# Patient Record
Sex: Female | Born: 1995
Health system: Southern US, Community
[De-identification: ages and names within clinical notes are randomized; demographics above are authoritative.]

## PROBLEM LIST (undated history)

## (undated) DIAGNOSIS — F32A Depression, unspecified: Secondary | ICD-10-CM

## (undated) DIAGNOSIS — F419 Anxiety disorder, unspecified: Secondary | ICD-10-CM

## (undated) DIAGNOSIS — K2970 Gastritis, unspecified, without bleeding: Secondary | ICD-10-CM

## (undated) HISTORY — DX: Depression, unspecified: F32.A

## (undated) HISTORY — DX: Anxiety disorder, unspecified: F41.9

## (undated) HISTORY — PX: OTHER SURGICAL HISTORY: SHX169

## (undated) HISTORY — DX: Gastritis, unspecified, without bleeding: K29.70

## (undated) HISTORY — PX: POLYPECTOMY: SHX149

---

## 2018-02-22 ENCOUNTER — Ambulatory Visit: Payer: Self-pay | Admitting: Family Medicine

## 2018-07-04 DIAGNOSIS — A048 Other specified bacterial intestinal infections: Secondary | ICD-10-CM

## 2018-07-04 HISTORY — DX: Other specified bacterial intestinal infections: A04.8

## 2019-06-17 DIAGNOSIS — R1013 Epigastric pain: Secondary | ICD-10-CM | POA: Diagnosis not present

## 2019-06-25 DIAGNOSIS — R1013 Epigastric pain: Secondary | ICD-10-CM | POA: Diagnosis not present

## 2019-06-26 ENCOUNTER — Other Ambulatory Visit: Payer: Self-pay | Admitting: Gastroenterology

## 2019-06-26 ENCOUNTER — Other Ambulatory Visit (HOSPITAL_COMMUNITY): Payer: Self-pay | Admitting: Gastroenterology

## 2019-06-26 DIAGNOSIS — R1084 Generalized abdominal pain: Secondary | ICD-10-CM

## 2019-06-26 DIAGNOSIS — R748 Abnormal levels of other serum enzymes: Secondary | ICD-10-CM

## 2019-07-12 ENCOUNTER — Ambulatory Visit
Admission: RE | Admit: 2019-07-12 | Discharge: 2019-07-12 | Disposition: A | Discharge status: Home / Self Care | Payer: BC Managed Care – PPO | Source: Ambulatory Visit | Attending: Gastroenterology | Admitting: Gastroenterology

## 2019-07-12 ENCOUNTER — Other Ambulatory Visit: Payer: Self-pay

## 2019-07-12 DIAGNOSIS — R748 Abnormal levels of other serum enzymes: Secondary | ICD-10-CM

## 2019-07-12 DIAGNOSIS — R1084 Generalized abdominal pain: Secondary | ICD-10-CM | POA: Diagnosis not present

## 2019-07-12 DIAGNOSIS — R109 Unspecified abdominal pain: Secondary | ICD-10-CM | POA: Diagnosis not present

## 2019-07-12 MED ORDER — IOHEXOL 300 MG/ML  SOLN
75.0000 mL | Freq: Once | INTRAMUSCULAR | Status: AC | PRN
  Administered 2019-07-12: 75 mL via INTRAVENOUS

## 2019-07-15 ENCOUNTER — Other Ambulatory Visit: Payer: Self-pay

## 2019-07-15 ENCOUNTER — Encounter: Payer: Self-pay | Admitting: Family Medicine

## 2019-07-15 ENCOUNTER — Ambulatory Visit: Payer: BC Managed Care – PPO | Admitting: Family Medicine

## 2019-07-15 VITALS — BP 105/70 | HR 75 | Ht 66.0 in | Wt 126.0 lb

## 2019-07-15 DIAGNOSIS — R5383 Other fatigue: Secondary | ICD-10-CM | POA: Diagnosis not present

## 2019-07-15 DIAGNOSIS — Z124 Encounter for screening for malignant neoplasm of cervix: Secondary | ICD-10-CM

## 2019-07-15 DIAGNOSIS — N946 Dysmenorrhea, unspecified: Secondary | ICD-10-CM

## 2019-07-15 NOTE — Progress Notes (Signed)
Having painful and heavy periods for few years Not on any birth control

## 2019-07-15 NOTE — Patient Instructions (Addendum)
   Website to read about methods: This website focuses on contraception but is a [email or web address]  Focus on IUD and Depoprovera-- these are the methods that treat heavy menses

## 2019-07-15 NOTE — Progress Notes (Signed)
   GYNECOLOGY PROBLEM  VISIT ENCOUNTER NOTE  Subjective:   Cynthia Cherry is a 24 y.o. G0P0000 female here for a problem GYN visit.  Current complaints: Reports having heavy and crampy periods for her "whole life" and reports taking NSAID for pain. She tried OCPs in 2018 and these helped with some of the issues of bleeding and cramping but she had other side effect. She is not nor has ever sexually active. She reports history of vague procedure/surgery when she had not had her period in high school where they "open her up"  Denies abnormal vaginal bleeding between periods, discharge, pelvic pain, or other gynecologic concerns.    Gynecologic History Patient's last menstrual period was 06/29/2019 (exact date). Contraception: none  Health Maintenance Due  Topic Date Due  . HIV Screening  03/24/2009  . TETANUS/TDAP  03/24/2013  . PAP-Cervical Cytology Screening  03/25/2015  . PAP SMEAR-Modifier  03/25/2015  . INFLUENZA VACCINE  02/02/2019     The following portions of the patient's history were reviewed and updated as appropriate: allergies, current medications, past family history, past medical history, past social history, past surgical history and problem list.  Review of Systems Pertinent items are noted in HPI.   Objective:  BP 105/70   Pulse 75   Ht 5\' 6"  (1.676 m)   Wt 126 lb (57.2 kg)   LMP 06/29/2019 (Exact Date)   BMI 20.34 kg/m  Gen: well appearing, NAD HEENT: no scleral icterus CV: RR Lung: Normal WOB Ext: warm well perfused Thyroid: not enlarged  PELVIC: Normal appearing external genitalia; normal appearing vaginal mucosa and nuliparous cervix located more to patient left as is the body of the uterus.  No abnormal discharge noted.  Pap smear obtained.  Normal uterine size, no other palpable masses, no uterine or adnexal tenderness.   Assessment and Plan:  1. Dysmenorrhea Recommended NSAIDs 600mg  starting one day before menses Reviewed options to control  cycle with OCP and possible bleeding reduction with depo or IUD. Patient would like to think about the methods before accepting an intervention.  Recommended possible dietary changes to cramping and gave patient a book reference. She also has gastritis so this might help with that as well.  - TSH - Hemoglobin A1c - CBC - Cytology - PAP  Please refer to After Visit Summary for other counseling recommendations.   Return in about 3 months (around 10/13/2019).  , MD, MPH, ABFM Attending Physician Faculty Practice- [facility] for [facility]

## 2019-07-16 LAB — CBC
Hematocrit: 36.6 % (ref 34.0–46.6)
Hemoglobin: 11.7 g/dL (ref 11.1–15.9)
MCH: 27.3 pg (ref 26.6–33.0)
MCHC: 32 g/dL (ref 31.5–35.7)
MCV: 86 fL (ref 79–97)
Platelets: 230 10*3/uL (ref 150–450)
RBC: 4.28 x10E6/uL (ref 3.77–5.28)
RDW: 13.6 % (ref 11.7–15.4)
WBC: 3.8 10*3/uL (ref 3.4–10.8)

## 2019-07-16 LAB — HEMOGLOBIN A1C
Est. average glucose Bld gHb Est-mCnc: 97 mg/dL
Hgb A1c MFr Bld: 5 % (ref 4.8–5.6)

## 2019-07-16 LAB — TSH: TSH: 1.3 u[IU]/mL (ref 0.450–4.500)

## 2019-07-17 ENCOUNTER — Telehealth: Payer: Self-pay | Admitting: *Deleted

## 2019-07-17 NOTE — Telephone Encounter (Signed)
Pt informed of lab results, and adding in a vitamin d level and we will call her back with those results and her pap results.

## 2019-07-17 NOTE — Telephone Encounter (Signed)
-----   Message from Federico Flake, MD sent at 07/16/2019  5:13 PM EST ----- Labs are all within normal limits. Will see if Vitamin can be added to samples taken.

## 2019-07-18 LAB — SPECIMEN STATUS REPORT

## 2019-07-18 LAB — CYTOLOGY - PAP: Diagnosis: NEGATIVE

## 2019-07-18 LAB — VITAMIN D 25 HYDROXY (VIT D DEFICIENCY, FRACTURES): Vit D, 25-Hydroxy: 54.4 ng/mL (ref 30.0–100.0)

## 2019-08-08 DIAGNOSIS — R748 Abnormal levels of other serum enzymes: Secondary | ICD-10-CM | POA: Diagnosis not present

## 2019-08-08 DIAGNOSIS — Z8619 Personal history of other infectious and parasitic diseases: Secondary | ICD-10-CM | POA: Diagnosis not present

## 2019-09-03 DIAGNOSIS — Z8619 Personal history of other infectious and parasitic diseases: Secondary | ICD-10-CM | POA: Diagnosis not present

## 2019-09-05 DIAGNOSIS — R748 Abnormal levels of other serum enzymes: Secondary | ICD-10-CM | POA: Diagnosis not present

## 2019-09-12 ENCOUNTER — Encounter: Payer: Self-pay | Admitting: Radiology

## 2019-11-24 DIAGNOSIS — Z20822 Contact with and (suspected) exposure to covid-19: Secondary | ICD-10-CM | POA: Diagnosis not present

## 2020-01-07 DIAGNOSIS — Z20822 Contact with and (suspected) exposure to covid-19: Secondary | ICD-10-CM | POA: Diagnosis not present

## 2020-01-31 DIAGNOSIS — Z760 Encounter for issue of repeat prescription: Secondary | ICD-10-CM | POA: Diagnosis not present

## 2020-02-12 DIAGNOSIS — Z20822 Contact with and (suspected) exposure to covid-19: Secondary | ICD-10-CM | POA: Diagnosis not present

## 2020-03-13 ENCOUNTER — Other Ambulatory Visit
Admission: RE | Admit: 2020-03-13 | Discharge: 2020-03-13 | Disposition: A | Discharge status: Home / Self Care | Payer: BC Managed Care – PPO | Source: Ambulatory Visit | Attending: Family Medicine | Admitting: Family Medicine

## 2020-03-13 DIAGNOSIS — M79661 Pain in right lower leg: Secondary | ICD-10-CM | POA: Diagnosis present

## 2020-03-13 DIAGNOSIS — M79662 Pain in left lower leg: Secondary | ICD-10-CM | POA: Insufficient documentation

## 2020-03-13 LAB — FIBRIN DERIVATIVES D-DIMER (ARMC ONLY): Fibrin derivatives D-dimer (ARMC): 312.14 ng/mL (FEU) (ref 0.00–499.00)

## 2021-04-01 ENCOUNTER — Ambulatory Visit: Payer: BC Managed Care – PPO | Admitting: Obstetrics & Gynecology

## 2021-04-01 ENCOUNTER — Encounter: Payer: Self-pay | Admitting: Obstetrics & Gynecology

## 2021-04-01 ENCOUNTER — Other Ambulatory Visit: Payer: Self-pay

## 2021-04-01 ENCOUNTER — Ambulatory Visit (INDEPENDENT_AMBULATORY_CARE_PROVIDER_SITE_OTHER): Payer: BC Managed Care – PPO | Admitting: Obstetrics & Gynecology

## 2021-04-01 VITALS — BP 102/67 | HR 65 | Ht 67.0 in | Wt 133.0 lb

## 2021-04-01 DIAGNOSIS — N946 Dysmenorrhea, unspecified: Secondary | ICD-10-CM

## 2021-04-01 DIAGNOSIS — N921 Excessive and frequent menstruation with irregular cycle: Secondary | ICD-10-CM | POA: Insufficient documentation

## 2021-04-01 NOTE — Progress Notes (Signed)
GYNECOLOGY OFFICE VISIT NOTE  History:   Cynthia Cherry is a 25 y.o. G0P0000 here today for discussion about management of chronic and worsening heavy and irregular bleeding accompanied by pain. Had visit in 07/2019 to discuss this, was offered hormonal therapy that she declined and NSAIDs were recommended which she did not take due to gastric upset.  She feels pain and symptoms have worsened over the past year. She denies any abnormal vaginal discharge or other concerns.    Past Medical History:  Diagnosis Date   Gastritis    Helicobacter pylori (H. pylori) infection 2020   Treated with anbx in 2020    Past Surgical History:  Procedure Laterality Date   OTHER SURGICAL HISTORY  2010?   Reports needing to be "opened up" to have period when in high school. Since this procedure she has had regular menses. Unsure if this was inperforate hymen or cervical stenosis   POLYPECTOMY      The following portions of the patient's history were reviewed and updated as appropriate: allergies, current medications, past family history, past medical history, past social history, past surgical history and problem list.   Health Maintenance:  Normal pap on 07/15/2019.   Review of Systems:  Pertinent items noted in HPI and remainder of comprehensive ROS otherwise negative.  Physical Exam:  BP 102/67   Pulse 65   Ht 5\' 7"  (1.702 m)   Wt 133 lb (60.3 kg)   LMP 03/21/2021 (Exact Date)   BMI 20.83 kg/m  CONSTITUTIONAL: Well-developed, well-nourished female in no acute distress.  HEENT:  Normocephalic, atraumatic. External right and left ear normal. No scleral icterus.  NECK: Normal range of motion, supple, no masses noted on observation SKIN: No rash noted. Not diaphoretic. No erythema. No pallor. MUSCULOSKELETAL: Normal range of motion. No edema noted. NEUROLOGIC: Alert and oriented to person, place, and time. Normal muscle tone coordination. No cranial nerve deficit noted. PSYCHIATRIC: Normal  mood and affect. Normal behavior. Normal judgment and thought content. CARDIOVASCULAR: Normal heart rate noted RESPIRATORY: Effort and breath sounds normal, no problems with respiration noted ABDOMEN: No masses noted.Nontender to palpation. No other overt distention noted.   PELVIC: Deferred     Assessment and Plan:     1. Menometrorrhagia 2. Dysmenorrhea Presentation concerning for endometriosis. Offered presumptive treatment with hormonal therapy (OCPs), patient reports she had worsening mental health symptoms on COCs in the past.  Offered trial of progestin only formulations, (Depo, pills, IUD), she declined. Discussed newer therapies such as Orilissa and MyFembree, but emphasized that they have similar hormones as OCPs but also have added GNRH-antagonist medication and may have more side effects.  She declined this. Offered surgical evaluation and management, but emphasized that hormonal therapy is used after surgery to help prevent further lesions/treat lesions not fulgurated; and definitive surgery in the form of hysterectomy and removal of ovaries will result in needing HRT for decades. She is not interested in any of these modalities for now. To treat her pain, recommended NSAIDs, she reports getting gastric upset with these and avoids taking them.  She ws advised to take Tylenol for pain. In the meantime, will get pelvic ultrasound to evaluate for other possible etiologies of her symptoms and manage accordingly. - 03/23/2021 PELVIC COMPLETE WITH TRANSVAGINAL; Future   Routine preventative health maintenance measures emphasized. Please refer to After Visit Summary for other counseling recommendations.   Return for any gynecologic concerns.    I spent 20 minutes dedicated to the care of this  patient including pre-visit review of records, face to face time with the patient discussing her conditions and treatments and post visit orders.    Jaynie Collins, MD, FACOG Obstetrician & Gynecologist,  [facility] for [employer], [facility]

## 2021-04-01 NOTE — Patient Instructions (Signed)
Endometriosis  Endometriosis is a condition in which a tissue similar to the endometrium grows in places outside the uterus. The endometrium is a tissue that forms the lining of the uterus. This tissue can grow in the organs that create the eggs (ovaries), the tubes that carry the eggs to the uterus (fallopian tubes), the vagina, and the bowel. This tissue most often grows on the ovaries and inner lining of the pelvic cavity (peritoneum). When the uterus sheds the endometrium every menstrual cycle, there is bleeding wherever these types of tissue are located. This can cause pain because blood is irritating to tissues that are not normally exposed to it. Endometriosis canalso make it harder for a woman to get pregnant. What are the causes? The cause of this condition is not known. What increases the risk? The following factors may make you more likely to develop this condition: Having a family history of endometriosis. Having never given birth. Starting your period at 10 years of age or younger. What are the signs or symptoms? Often, there are no symptoms of this condition. If you do have symptoms, they may: Vary depending on where the abnormal tissue is growing. Occur during your menstrual period (most often) or at the middle of your cycle. Come and go. You may have no symptoms during some months. Stop when you no longer have your monthly periods (menopause). Symptoms may include: Pain in the area between your hip bones (pelvis). Heavier bleeding during periods. Menstrual periods that happen more than once a month. Pain during sex. Pain in the back or abdomen. Painful bowel movements. Not being able to get pregnant. How is this diagnosed? This condition is diagnosed based on your symptoms and a physical exam. You may have tests, such as: Blood tests and urine tests to help rule out other causes. Ultrasound to look for tissues that are not normal. This is often done over your skin. It is  sometimes done through the vagina (transvaginal). X-ray of the lower bowel (barium enema). CT scan. MRI. To confirm the diagnosis, your health care provider may use a device with a small camera to check tissue inside your abdomen (laparoscopy). Abnormal tissue may be removed and checked in a lab (biopsy). How is this treated? There is no cure for this condition. Treatment focuses on controlling your symptoms. The type of treatment also depends on whether you want to become pregnant in the future. This condition may be treated with: Medicines. These may include: Medicines to relieve pain, including NSAIDs such as ibuprofen. Hormone therapy. This uses artificial hormones to slow the growth of the abnormal tissue. This may include hormonal birth control, such as pills. Surgery to remove the abnormal tissue. During surgery: Tissue may be removed using a laparoscope and a laser (laparoscopic laser treatment). The fallopian tubes, uterus, and ovaries may be removed (hysterectomy). This is done in very severe cases. Follow these instructions at home: Get regular exercise. Limit alcohol use. Eat a balanced diet. Avoid caffeine. Take over-the-counter and prescription medicines only as told by your health care provider. Keep all follow-up visits as told by your health care provider. This is important. Where to find more information American College of Obstetricians and Gynecologists: https://www.acog.org/ Office on Women's Health: https://www.womenshealth.gov/ Contact a health care provider if: You are having new pain or trouble controlling pain. You have problems getting pregnant. You have a fever. Get help right away if you have: Severe pain that does not get better with medicine. Severe nausea and vomiting, or   if you cannot eat or drink without vomiting. Pain that affects your abdomen only on the lower, right side. Pain in your abdomen that gets worse. Swelling in your abdomen. Blood in  your stool (feces). Summary Endometriosis is a condition in which a tissue similar to the endometrium grows in places outside the uterus. The endometrium is a tissue that forms the lining of the uterus. The cause of this condition is not known. This condition may be treated with medicines to relieve pain, hormone therapy, or surgery. If you have this condition, get regular exercise, limit alcohol use, and avoid caffeine. Get help right away if you have severe pain that does not get better with medicine, or if you have severe nausea and vomiting or blood in your stool. This information is not intended to replace advice given to you by your health care provider. Make sure you discuss any questions you have with your healthcare provider. Document Revised: 08/07/2019 Document Reviewed: 08/07/2019 Elsevier Patient Education  2021 Elsevier Inc.  

## 2021-04-09 ENCOUNTER — Other Ambulatory Visit: Payer: Self-pay

## 2021-04-09 ENCOUNTER — Ambulatory Visit (HOSPITAL_COMMUNITY)
Admission: RE | Admit: 2021-04-09 | Discharge: 2021-04-09 | Disposition: A | Discharge status: Home / Self Care | Payer: BC Managed Care – PPO | Source: Ambulatory Visit | Attending: Obstetrics & Gynecology | Admitting: Obstetrics & Gynecology

## 2021-04-09 DIAGNOSIS — N921 Excessive and frequent menstruation with irregular cycle: Secondary | ICD-10-CM

## 2021-04-09 DIAGNOSIS — N946 Dysmenorrhea, unspecified: Secondary | ICD-10-CM | POA: Diagnosis present

## 2021-06-03 ENCOUNTER — Encounter: Payer: Self-pay | Admitting: Radiology

## 2021-10-24 IMAGING — CT CT ABD-PELV W/ CM
2 of 4 series · 16 of 46 positions shown, 18 images · IV contrast (omnipaque)
Comparison: None.

CLINICAL DATA: Lower abdominal pain for 1 month. Elevated lipase.

EXAM:
CT ABDOMEN AND PELVIS WITH CONTRAST
TECHNIQUE: Multidetector CT imaging of the abdomen and pelvis was performed
using the standard protocol following bolus administration of
intravenous contrast.
CONTRAST:  75mL OMNIPAQUE IOHEXOL 300 MG/ML  SOLN

[Series 2: abd pelvis 5.00 · axial · 0.66mm/px · z∈[-1508,-1068]mm · 13 of 96 slices shown, 15 images]
[im 4/96  soft-tissue]
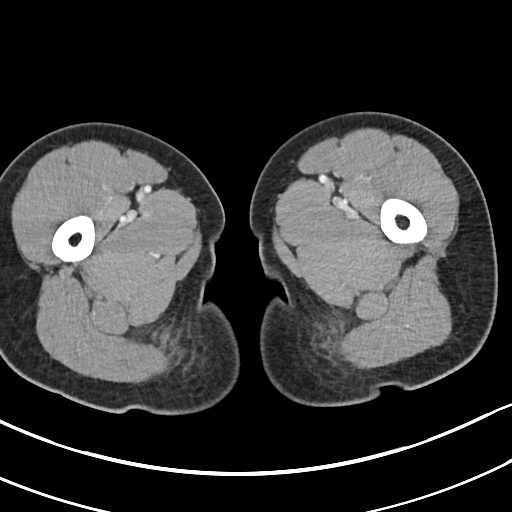
[im 4/96  bone]
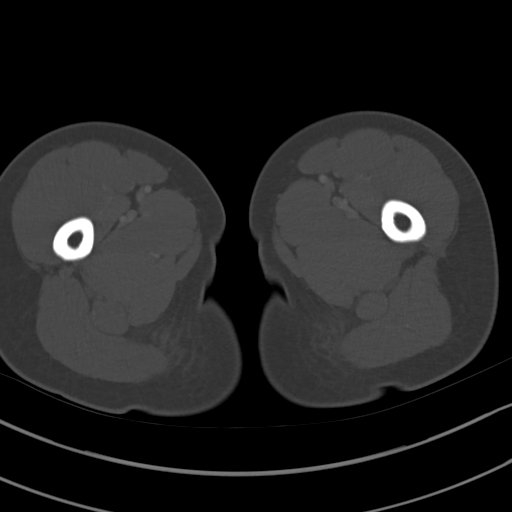
[im 12/96  soft-tissue]
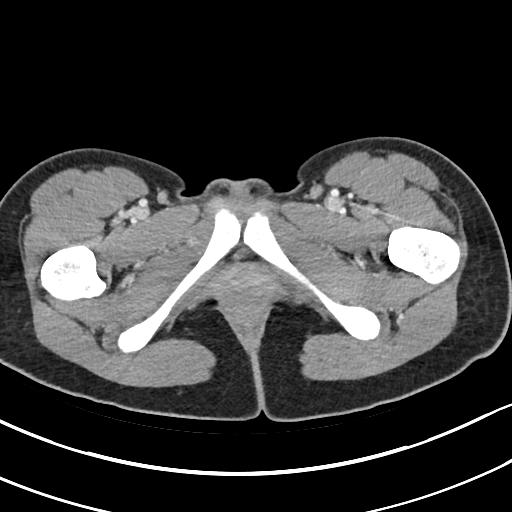
[im 20/96  soft-tissue]
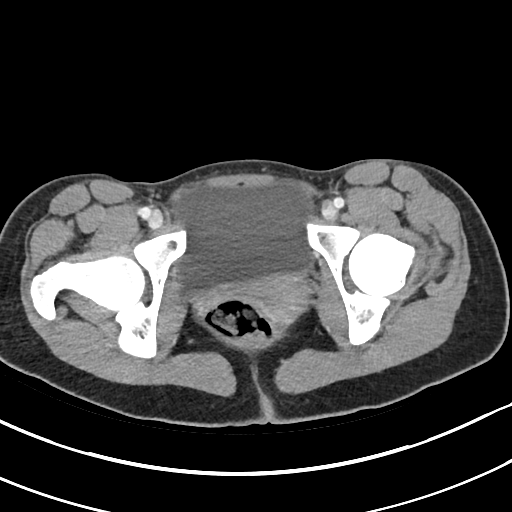
[im 27/96  soft-tissue]
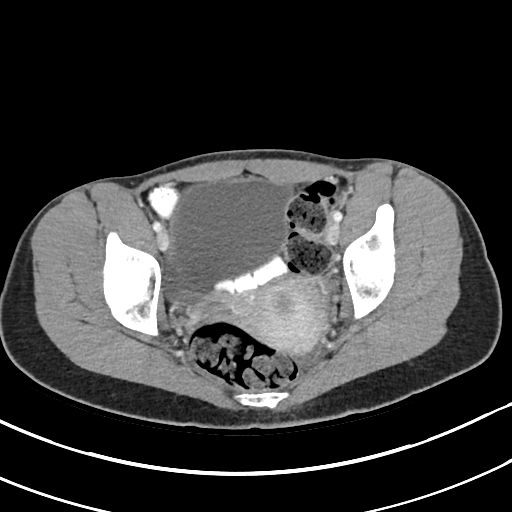
[im 35/96  soft-tissue]
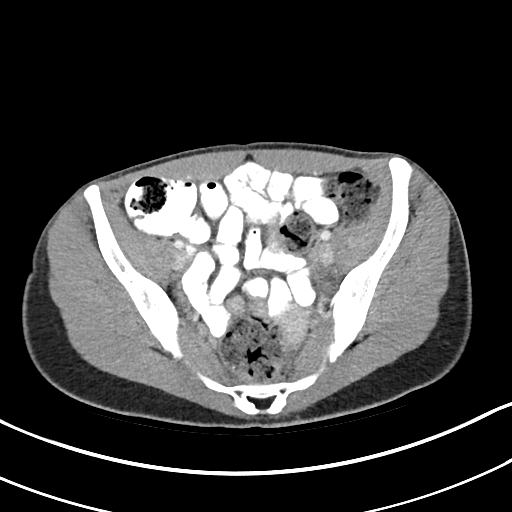
[im 42/96  soft-tissue]
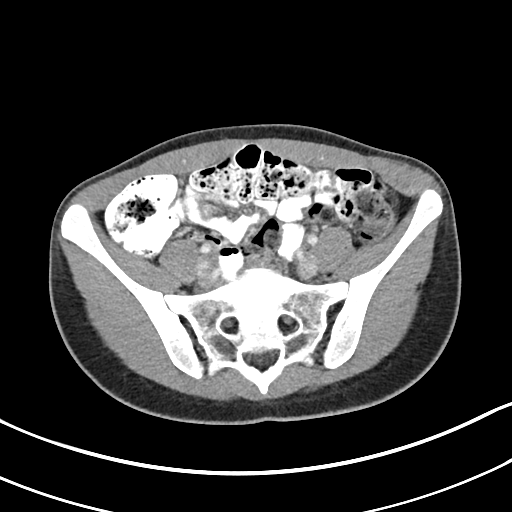
[im 50/96  soft-tissue]
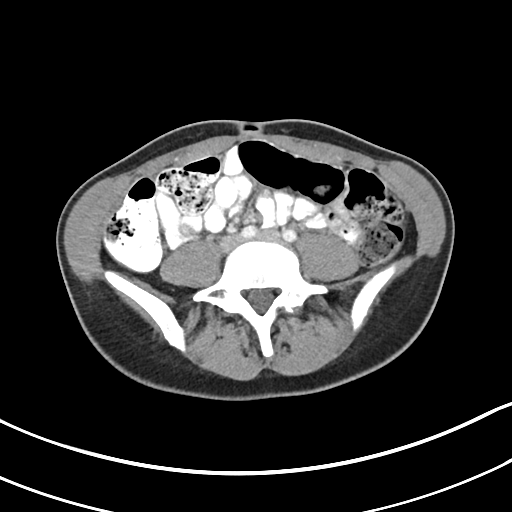
[im 54/96  soft-tissue]
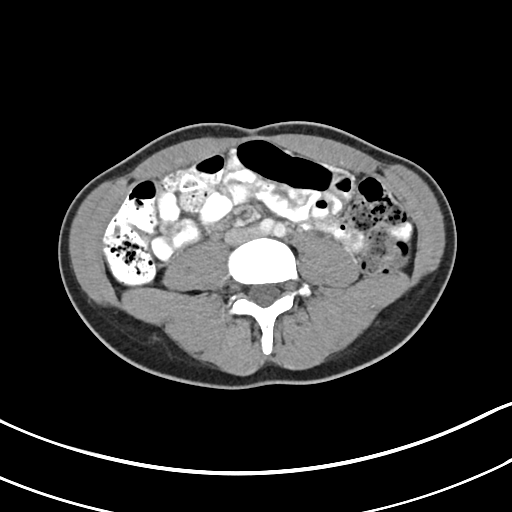
[im 61/96  soft-tissue]
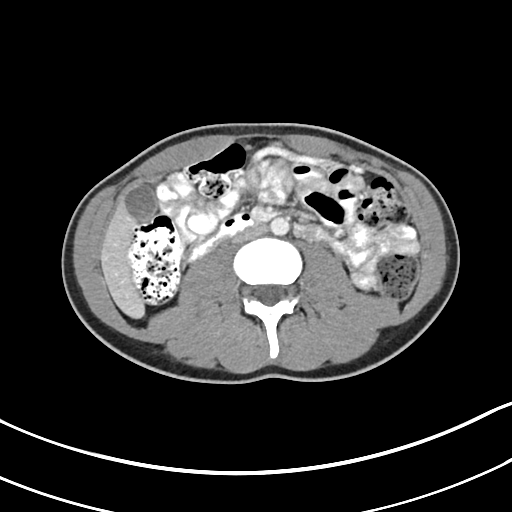
[im 61/96  bone]
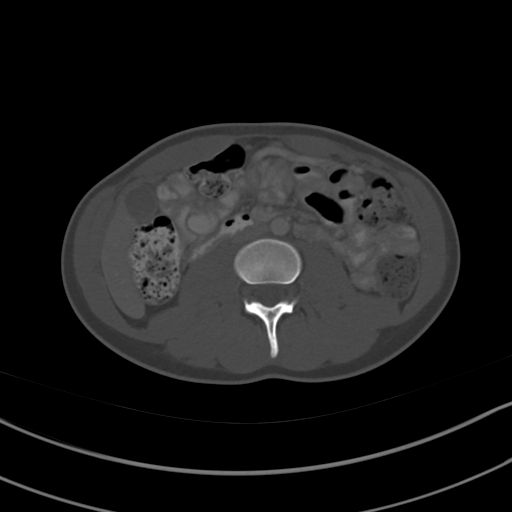
[im 69/96  soft-tissue]
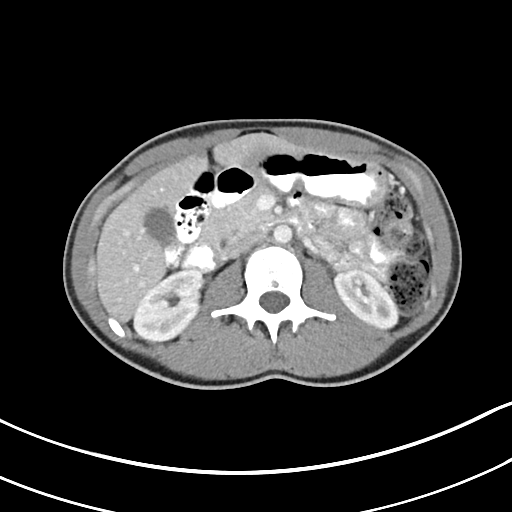
[im 77/96  soft-tissue]
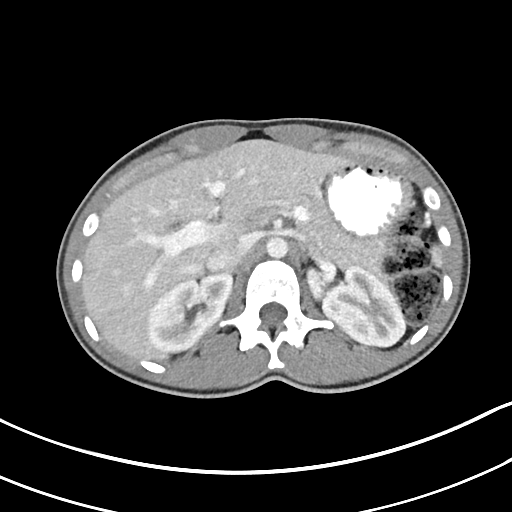
[im 84/96  soft-tissue]
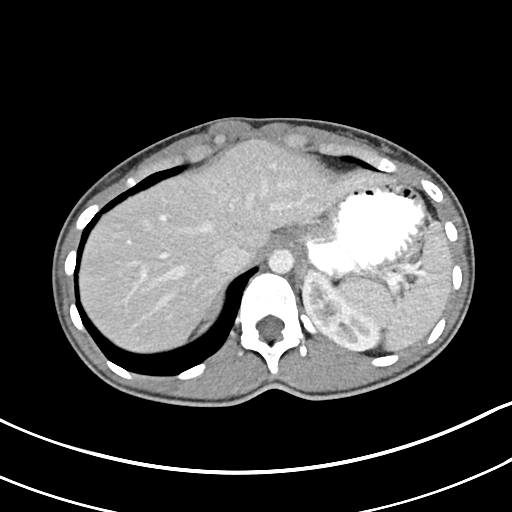
[im 92/96  soft-tissue]
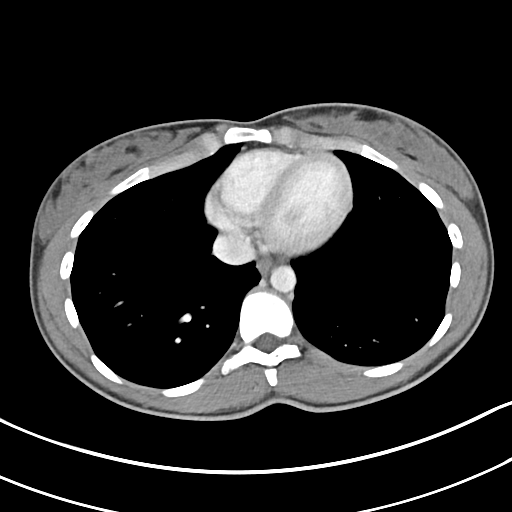

[Series 4: coronals abd pelvis 2.00 cor · coronal · 0.66mm/px · 3 of 111 slices shown]
[im 37/111  soft-tissue]
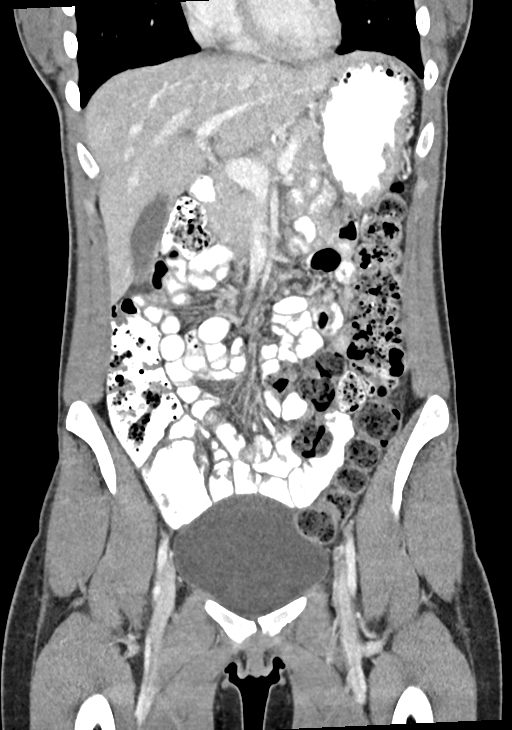
[im 49/111  soft-tissue]
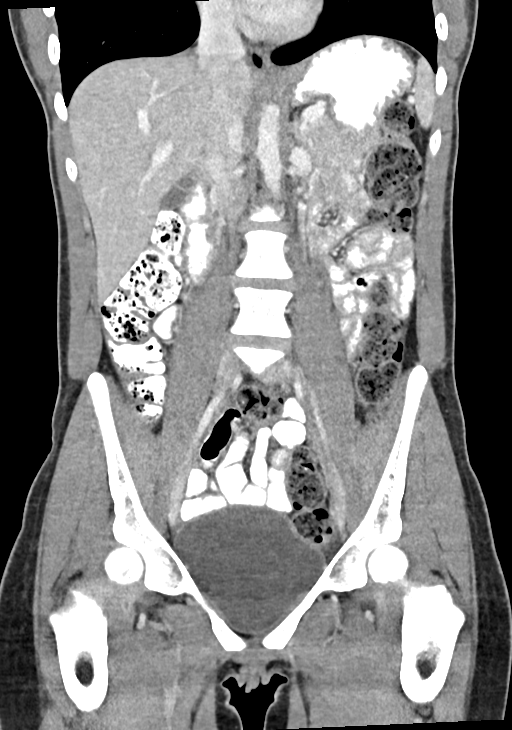
[im 62/111  soft-tissue]
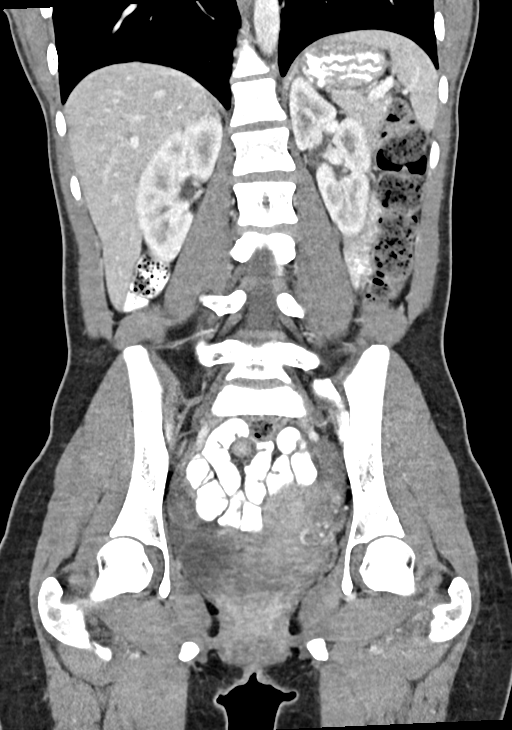

[16 of 46 positions shown; findings below may reference images not displayed]

FINDINGS: Lower Chest: No acute findings.

Hepatobiliary: No hepatic masses identified. Gallbladder is
unremarkable. No evidence of biliary ductal dilatation.

Pancreas:  No mass or inflammatory changes.

Spleen: Within normal limits in size and appearance.

Adrenals/Urinary Tract: No masses identified. No evidence of
hydronephrosis.

Stomach/Bowel: No evidence of obstruction, inflammatory process or
abnormal fluid collections. Normal appendix visualized.

Vascular/Lymphatic: No pathologically enlarged lymph nodes. No
abdominal aortic aneurysm.

Reproductive:  No mass or other significant abnormality.

Other:  None.

Musculoskeletal:  No suspicious bone lesions identified.
IMPRESSION: Negative. No acute findings or other significant abnormality.

## 2022-05-30 ENCOUNTER — Inpatient Hospital Stay: Payer: BC Managed Care – PPO | Attending: Internal Medicine | Admitting: Internal Medicine

## 2022-05-30 ENCOUNTER — Encounter: Payer: Self-pay | Admitting: Internal Medicine

## 2022-05-30 ENCOUNTER — Inpatient Hospital Stay: Payer: BC Managed Care – PPO

## 2022-05-30 VITALS — BP 100/73 | HR 67 | Temp 98.7°F | Resp 20 | Wt 130.6 lb

## 2022-05-30 DIAGNOSIS — E611 Iron deficiency: Secondary | ICD-10-CM | POA: Diagnosis not present

## 2022-05-30 DIAGNOSIS — Z79899 Other long term (current) drug therapy: Secondary | ICD-10-CM | POA: Diagnosis not present

## 2022-05-30 DIAGNOSIS — Z8619 Personal history of other infectious and parasitic diseases: Secondary | ICD-10-CM | POA: Diagnosis not present

## 2022-05-30 DIAGNOSIS — Z809 Family history of malignant neoplasm, unspecified: Secondary | ICD-10-CM | POA: Insufficient documentation

## 2022-05-30 DIAGNOSIS — R760 Raised antibody titer: Secondary | ICD-10-CM | POA: Diagnosis not present

## 2022-05-30 DIAGNOSIS — D709 Neutropenia, unspecified: Secondary | ICD-10-CM | POA: Diagnosis not present

## 2022-05-30 DIAGNOSIS — F32A Depression, unspecified: Secondary | ICD-10-CM | POA: Insufficient documentation

## 2022-05-30 DIAGNOSIS — N920 Excessive and frequent menstruation with regular cycle: Secondary | ICD-10-CM | POA: Insufficient documentation

## 2022-05-30 DIAGNOSIS — N921 Excessive and frequent menstruation with irregular cycle: Secondary | ICD-10-CM | POA: Diagnosis not present

## 2022-05-30 DIAGNOSIS — D500 Iron deficiency anemia secondary to blood loss (chronic): Secondary | ICD-10-CM

## 2022-05-30 DIAGNOSIS — D72819 Decreased white blood cell count, unspecified: Secondary | ICD-10-CM | POA: Insufficient documentation

## 2022-05-30 DIAGNOSIS — D509 Iron deficiency anemia, unspecified: Secondary | ICD-10-CM | POA: Insufficient documentation

## 2022-05-30 LAB — CBC WITH DIFFERENTIAL/PLATELET
Abs Immature Granulocytes: 0 10*3/uL (ref 0.00–0.07)
Basophils Absolute: 0 10*3/uL (ref 0.0–0.1)
Basophils Relative: 1 %
Eosinophils Absolute: 0.1 10*3/uL (ref 0.0–0.5)
Eosinophils Relative: 2 %
HCT: 36.9 % (ref 36.0–46.0)
Hemoglobin: 12 g/dL (ref 12.0–15.0)
Immature Granulocytes: 0 %
Lymphocytes Relative: 39 %
Lymphs Abs: 1.1 10*3/uL (ref 0.7–4.0)
MCH: 28.9 pg (ref 26.0–34.0)
MCHC: 32.5 g/dL (ref 30.0–36.0)
MCV: 88.9 fL (ref 80.0–100.0)
Monocytes Absolute: 0.2 10*3/uL (ref 0.1–1.0)
Monocytes Relative: 7 %
Neutro Abs: 1.5 10*3/uL — ABNORMAL LOW (ref 1.7–7.7)
Neutrophils Relative %: 51 %
Platelets: 220 10*3/uL (ref 150–400)
RBC: 4.15 MIL/uL (ref 3.87–5.11)
RDW: 12.8 % (ref 11.5–15.5)
WBC: 2.9 10*3/uL — ABNORMAL LOW (ref 4.0–10.5)
nRBC: 0 % (ref 0.0–0.2)

## 2022-05-30 LAB — FOLATE: Folate: 9.9 ng/mL (ref >5.9)

## 2022-05-30 LAB — HEPATITIS B SURFACE ANTIGEN: Hepatitis B Surface Ag: NONREACTIVE

## 2022-05-30 LAB — HEPATITIS B CORE ANTIBODY, IGM: Hep B C IgM: NONREACTIVE

## 2022-05-30 LAB — HIV ANTIBODY (ROUTINE TESTING W REFLEX): HIV Screen 4th Generation wRfx: NONREACTIVE

## 2022-05-30 LAB — HEPATITIS C ANTIBODY: HCV Ab: NONREACTIVE

## 2022-05-30 NOTE — Progress Notes (Addendum)
[facility]  [phone] [phone]  ID: Cynthia Cherry OB: 31-Aug-1995  MR#: [id number]  AJO#:[id number]  Patient Care Team: Patrice Paradise, MD as PCP - General (Physician Assistant)  REFERRING PROVIDER: Dr. Merlinda Frederick  REASON FOR REFERRAL: leukopenia, neutropenia  HPI: Cynthia Cherry is a 26 y.o. female with past medical history of H. pylori infection in 2020, depression was referred to hematology for further workup of leukopenia.  Patient reports increased infections primarily colds since 2020.  Denies any pneumonias or hospitalizations.  She gets colds easily when exposed to sick people. There is no pattern to it. It depends on the exposure. Denies any fevers, night sweats, weight loss, shortness of breath, chest pain.  She has heavy menstrual period requiring frequent pad changes on initial 3 to 4 days, lasts total of 7 days.  Has history of iron deficiency and has been on oral iron for years.  ANA was positive and she has been referred to rheumatology in [facility] by PCP.  She has intermittent right shoulder and wrist pain.  Otherwise denies any other joint pains, oral ulcers, rash, Raynaud's, photosensitivity..   Labs reviewed. CBC with differential showed WBC 2.6, hemoglobin 12.4, platelet 169, ANC 1.3.  She has leukopenia dating back to 2020.  B12 > 1500.  Ferritin 8, saturation 57%.  REVIEW OF SYSTEMS:   ROS  As per HPI. Otherwise, a complete review of systems is negative.  PAST MEDICAL HISTORY: Past Medical History:  Diagnosis Date   Gastritis    Helicobacter pylori (H. pylori) infection 2020   Treated with anbx in 2020    PAST SURGICAL HISTORY: Past Surgical History:  Procedure Laterality Date   OTHER SURGICAL HISTORY  2010?   Reports needing to be "opened up" to have period when in high school. Since this procedure she has had regular menses. Unsure if this was inperforate hymen or cervical stenosis    POLYPECTOMY      FAMILY HISTORY: Family History  Problem Relation Age of Onset   Cancer Sister    Cancer Paternal Uncle    Cancer Maternal Grandfather    Endometriosis Neg Hx    Fibroids Neg Hx    Diabetes Neg Hx     HEALTH MAINTENANCE: Social History   Tobacco Use   Smoking status: Never   Smokeless tobacco: Never  Vaping Use   Vaping Use: Never used  Substance Use Topics   Alcohol use: Not Currently   Drug use: Not Currently     Allergies  Allergen Reactions   Almond (Diagnostic)    Apple Itching   Daucus Carota Itching   Pollen Extract Itching    Current Outpatient Medications  Medication Sig Dispense Refill   FLUoxetine (PROZAC) 10 MG tablet Take 10 mg by mouth daily.     No current facility-administered medications for this visit.    OBJECTIVE: Vitals:   05/30/22 1006  BP: 100/73  Pulse: 67  Resp: 20  Temp: 98.7 F (37.1 C)  SpO2: 100%     Body mass index is 20.45 kg/m.      General: Well-developed, well-nourished, no acute distress. Eyes: Pink conjunctiva, anicteric sclera. HEENT: Normocephalic, moist mucous membranes, clear oropharnyx. Lungs: Clear to auscultation bilaterally. Heart: Regular rate and rhythm. No rubs, murmurs, or gallops. Abdomen: Soft, nontender, nondistended. No organomegaly noted, normoactive bowel sounds. Musculoskeletal: No edema, cyanosis, or clubbing. Neuro: Alert, answering all questions appropriately. Cranial nerves grossly intact. Skin: No rashes or petechiae noted. Psych: Normal affect. Lymphatics:  No cervical, calvicular, axillary or inguinal LAD.   LAB RESULTS:  No results found for: "NA", "K", "CL", "CO2", "GLUCOSE", "BUN", "CREATININE", "CALCIUM", "PROT", "ALBUMIN", "AST", "ALT", "ALKPHOS", "BILITOT", "GFRNONAA", "GFRAA"  Lab Results  Component Value Date   WBC 2.9 (L) 05/30/2022   NEUTROABS 1.5 (L) 05/30/2022   HGB 12.0 05/30/2022   HCT 36.9 05/30/2022   MCV 88.9 05/30/2022   PLT 220 05/30/2022     No results found for: "TIBC", "FERRITIN", "IRONPCTSAT"   STUDIES: No results found.  ASSESSMENT AND PLAN:   Cynthia Cherry is a 26 y.o. female with pmh of H. pylori infection in 2020, depression was referred to hematology for further workup of leukopenia.  #Leukopenia -Of unknown etiology -Labs reviewed. CBC with differential showed WBC 2.6, hemoglobin 12.4, platelet 169, ANC 1.3.  She has leukopenia dating back to 2020.  Prior to 2020, WBC was normal.  Only medication she is on is fluoxetine.  Denies any over-the-counter or herbal supplements. -Workup as below to assess for etiology.  Discussed with the patient and mother about possible causes such as nutritional deficiency, autoimmune, viral infection.  ANA was positive but does not have other symptoms suggestive of autoimmune disease.  She was referred to rheumatology clinic by her primary.  She reports increased frequency of colds since 2020 with no pattern.    #Iron deficiency -Ferritin 8 but saturation 57%.  She was on iron pills at that time which could cause the discrepancy.  However ferritin is more specific marker for iron deficiency.  I will repeat iron panel today.  She has been taking oral iron for several years but labs show that she is not responding. -Has heavy menstrual period. -Schedule for IV Feraheme weekly x 2.   Orders Placed This Encounter  Procedures   Folate   Hepatitis C antibody   Hepatitis B surface antigen   Hepatitis B core antibody, IgM   HIV ANTIBODY (ROUTINE TETSING W RELFEX)   Flow cytometry panel-leukemia/lymphoma work-up   CBC with Differential   Ferritin   Iron and TIBC([facility])    RTC in 2 months for MD visit, to discuss labs IV Feraheme weekly x 2.  Patient expressed understanding and was in agreement with this plan. She also understands that She can call clinic at any time with any questions, concerns, or complaints.   I spent a total of 45 minutes reviewing chart  data, face-to-face evaluation with the patient, counseling and coordination of care as detailed above.  Michaelyn Barter, MD   05/30/2022 1:36 PM

## 2022-06-01 ENCOUNTER — Other Ambulatory Visit: Payer: Self-pay | Admitting: *Deleted

## 2022-06-01 DIAGNOSIS — D500 Iron deficiency anemia secondary to blood loss (chronic): Secondary | ICD-10-CM

## 2022-06-01 LAB — COMP PANEL: LEUKEMIA/LYMPHOMA

## 2022-06-02 ENCOUNTER — Ambulatory Visit
Admission: RE | Admit: 2022-06-02 | Discharge: 2022-06-02 | Disposition: A | Discharge status: Home / Self Care | Payer: BC Managed Care – PPO | Source: Ambulatory Visit | Attending: Rheumatology | Admitting: Rheumatology

## 2022-06-02 ENCOUNTER — Other Ambulatory Visit: Payer: Self-pay | Admitting: Rheumatology

## 2022-06-02 ENCOUNTER — Encounter: Payer: Self-pay | Admitting: Internal Medicine

## 2022-06-02 DIAGNOSIS — M419 Scoliosis, unspecified: Secondary | ICD-10-CM | POA: Diagnosis not present

## 2022-06-02 NOTE — Addendum Note (Signed)
Addended byMichaelyn Barter on: 06/02/2022 09:42 AM   Modules accepted: Orders

## 2022-06-07 ENCOUNTER — Encounter: Payer: Self-pay | Admitting: Internal Medicine

## 2022-06-10 ENCOUNTER — Inpatient Hospital Stay: Payer: BC Managed Care – PPO

## 2022-06-10 ENCOUNTER — Inpatient Hospital Stay: Payer: BC Managed Care – PPO | Attending: Internal Medicine

## 2022-06-10 VITALS — BP 95/59 | HR 73 | Temp 97.1°F | Resp 14

## 2022-06-10 DIAGNOSIS — D500 Iron deficiency anemia secondary to blood loss (chronic): Secondary | ICD-10-CM

## 2022-06-10 DIAGNOSIS — Z79899 Other long term (current) drug therapy: Secondary | ICD-10-CM | POA: Diagnosis not present

## 2022-06-10 DIAGNOSIS — D509 Iron deficiency anemia, unspecified: Secondary | ICD-10-CM | POA: Insufficient documentation

## 2022-06-10 DIAGNOSIS — D709 Neutropenia, unspecified: Secondary | ICD-10-CM

## 2022-06-10 LAB — CBC WITH DIFFERENTIAL/PLATELET
Abs Immature Granulocytes: 0.01 10*3/uL (ref 0.00–0.07)
Basophils Absolute: 0 10*3/uL (ref 0.0–0.1)
Basophils Relative: 1 %
Eosinophils Absolute: 0.1 10*3/uL (ref 0.0–0.5)
Eosinophils Relative: 3 %
HCT: 36 % (ref 36.0–46.0)
Hemoglobin: 11.9 g/dL — ABNORMAL LOW (ref 12.0–15.0)
Immature Granulocytes: 0 %
Lymphocytes Relative: 35 %
Lymphs Abs: 1.3 10*3/uL (ref 0.7–4.0)
MCH: 29.2 pg (ref 26.0–34.0)
MCHC: 33.1 g/dL (ref 30.0–36.0)
MCV: 88.2 fL (ref 80.0–100.0)
Monocytes Absolute: 0.3 10*3/uL (ref 0.1–1.0)
Monocytes Relative: 9 %
Neutro Abs: 1.9 10*3/uL (ref 1.7–7.7)
Neutrophils Relative %: 52 %
Platelets: 194 10*3/uL (ref 150–400)
RBC: 4.08 MIL/uL (ref 3.87–5.11)
RDW: 12.7 % (ref 11.5–15.5)
WBC: 3.6 10*3/uL — ABNORMAL LOW (ref 4.0–10.5)
nRBC: 0 % (ref 0.0–0.2)

## 2022-06-10 LAB — PREGNANCY, URINE: Preg Test, Ur: NEGATIVE

## 2022-06-10 MED ORDER — SODIUM CHLORIDE 0.9 % IV SOLN
510.0000 mg | Freq: Once | INTRAVENOUS | Status: AC
  Administered 2022-06-10: 510 mg via INTRAVENOUS
  Filled 2022-06-10: qty 510

## 2022-06-10 MED ORDER — SODIUM CHLORIDE 0.9 % IV SOLN
Freq: Once | INTRAVENOUS | Status: AC
  Filled 2022-06-10: qty 250

## 2022-06-10 MED FILL — Ferumoxytol Inj 510 MG/17ML (30 MG/ML) (Elemental Fe): INTRAVENOUS | Qty: 17 | Status: AC

## 2022-06-10 NOTE — Patient Instructions (Signed)

## 2022-06-13 ENCOUNTER — Other Ambulatory Visit: Payer: Self-pay | Admitting: Internal Medicine

## 2022-06-13 DIAGNOSIS — J00 Acute nasopharyngitis [common cold]: Secondary | ICD-10-CM

## 2022-06-13 DIAGNOSIS — D500 Iron deficiency anemia secondary to blood loss (chronic): Secondary | ICD-10-CM

## 2022-06-17 ENCOUNTER — Inpatient Hospital Stay: Payer: BC Managed Care – PPO

## 2022-06-20 ENCOUNTER — Encounter: Payer: Self-pay | Admitting: Internal Medicine

## 2022-06-24 ENCOUNTER — Inpatient Hospital Stay: Payer: BC Managed Care – PPO

## 2022-07-01 ENCOUNTER — Inpatient Hospital Stay: Payer: BC Managed Care – PPO

## 2022-07-07 ENCOUNTER — Encounter: Payer: Self-pay | Admitting: Internal Medicine

## 2022-07-08 ENCOUNTER — Inpatient Hospital Stay: Payer: Managed Care, Other (non HMO)

## 2022-07-15 ENCOUNTER — Inpatient Hospital Stay: Payer: Managed Care, Other (non HMO)

## 2022-07-18 ENCOUNTER — Encounter: Payer: Self-pay | Admitting: Internal Medicine

## 2022-07-25 ENCOUNTER — Encounter: Payer: Self-pay | Admitting: Internal Medicine

## 2022-07-25 ENCOUNTER — Inpatient Hospital Stay: Payer: Managed Care, Other (non HMO) | Attending: Internal Medicine | Admitting: Internal Medicine

## 2022-07-25 ENCOUNTER — Inpatient Hospital Stay: Payer: Managed Care, Other (non HMO)

## 2022-07-25 VITALS — BP 101/66 | HR 65 | Temp 97.8°F | Resp 18 | Wt 133.4 lb

## 2022-07-25 DIAGNOSIS — D72819 Decreased white blood cell count, unspecified: Secondary | ICD-10-CM | POA: Insufficient documentation

## 2022-07-25 DIAGNOSIS — D709 Neutropenia, unspecified: Secondary | ICD-10-CM | POA: Diagnosis not present

## 2022-07-25 DIAGNOSIS — D500 Iron deficiency anemia secondary to blood loss (chronic): Secondary | ICD-10-CM | POA: Diagnosis not present

## 2022-07-25 DIAGNOSIS — E611 Iron deficiency: Secondary | ICD-10-CM | POA: Diagnosis not present

## 2022-07-25 DIAGNOSIS — R519 Headache, unspecified: Secondary | ICD-10-CM | POA: Diagnosis not present

## 2022-07-25 DIAGNOSIS — M25511 Pain in right shoulder: Secondary | ICD-10-CM | POA: Insufficient documentation

## 2022-07-25 DIAGNOSIS — J00 Acute nasopharyngitis [common cold]: Secondary | ICD-10-CM

## 2022-07-25 LAB — IRON AND TIBC
Iron: 88 ug/dL (ref 28–170)
Saturation Ratios: 28 % (ref 10.4–31.8)
TIBC: 311 ug/dL (ref 250–450)
UIBC: 223 ug/dL

## 2022-07-25 LAB — CBC WITH DIFFERENTIAL/PLATELET
Abs Immature Granulocytes: 0 10*3/uL (ref 0.00–0.07)
Basophils Absolute: 0 10*3/uL (ref 0.0–0.1)
Basophils Relative: 1 %
Eosinophils Absolute: 0.1 10*3/uL (ref 0.0–0.5)
Eosinophils Relative: 3 %
HCT: 36.3 % (ref 36.0–46.0)
Hemoglobin: 12.5 g/dL (ref 12.0–15.0)
Immature Granulocytes: 0 %
Lymphocytes Relative: 42 %
Lymphs Abs: 1.1 10*3/uL (ref 0.7–4.0)
MCH: 29.8 pg (ref 26.0–34.0)
MCHC: 34.4 g/dL (ref 30.0–36.0)
MCV: 86.6 fL (ref 80.0–100.0)
Monocytes Absolute: 0.3 10*3/uL (ref 0.1–1.0)
Monocytes Relative: 10 %
Neutro Abs: 1.2 10*3/uL — ABNORMAL LOW (ref 1.7–7.7)
Neutrophils Relative %: 44 %
Platelets: 190 10*3/uL (ref 150–400)
RBC: 4.19 MIL/uL (ref 3.87–5.11)
RDW: 13.7 % (ref 11.5–15.5)
WBC: 2.6 10*3/uL — ABNORMAL LOW (ref 4.0–10.5)
nRBC: 0 % (ref 0.0–0.2)

## 2022-07-25 LAB — FERRITIN: Ferritin: 40 ng/mL (ref 11–307)

## 2022-07-25 NOTE — Progress Notes (Signed)
Cynthia Cherry  Telephone:([phone]) [phone] Fax:([phone]  ID: Pamella Pert OB: 10/20/1995  MR#: [id number]  AOZ#:[id number]  Patient Care Team: Marinda Elk, MD as PCP - General (Physician Assistant)   HPI: Sheronica Corey is a 27 y.o. female with past medical history of H. pylori infection in 2020, depression was referred to hematology for further workup of leukopenia.  Patient reports increased infections primarily colds since 2020.  Denies any pneumonias or hospitalizations.  She gets colds easily when exposed to sick people. There is no pattern to it. It depends on the exposure. Denies any fevers, night sweats, weight loss, shortness of breath, chest pain.  She has heavy menstrual period requiring frequent pad changes on initial 3 to 4 days, lasts total of 7 days.  Has history of iron deficiency and has been on oral iron for years.  ANA was positive and she has been referred to rheumatology in [place] clinic by PCP.  She has intermittent right shoulder and wrist pain.  Otherwise denies any other joint pains, oral ulcers, rash, Raynaud's, photosensitivity..   Labs reviewed. CBC with differential showed WBC 2.6, hemoglobin 12.4, platelet 169, ANC 1.3.  She has leukopenia dating back to 2020.  B12 > 1500.  Ferritin 8, saturation 57%.  INTERVAL HISTORY-  Patient was seen today to discuss the lab results. Recently, she has been having some headache and right shoulder pain.  She had x-rays done with rheumatology which was unremarkable.  She will start physical therapy.  REVIEW OF SYSTEMS:   Review of Systems  Musculoskeletal:  Positive for joint pain.  Neurological:  Positive for headaches.    As per HPI. Otherwise, a complete review of systems is negative.  PAST MEDICAL HISTORY: Past Medical History:  Diagnosis Date   Gastritis    Helicobacter pylori (H. pylori) infection 2020   Treated with anbx in 2020    PAST SURGICAL HISTORY: Past  Surgical History:  Procedure Laterality Date   OTHER SURGICAL HISTORY  2010?   Reports needing to be "opened up" to have period when in high school. Since this procedure she has had regular menses. Unsure if this was inperforate hymen or cervical stenosis   POLYPECTOMY      FAMILY HISTORY: Family History  Problem Relation Age of Onset   Cancer Sister    Cancer Paternal Uncle    Cancer Maternal Grandfather    Endometriosis Neg Hx    Fibroids Neg Hx    Diabetes Neg Hx     HEALTH MAINTENANCE: Social History   Tobacco Use   Smoking status: Never   Smokeless tobacco: Never  Vaping Use   Vaping Use: Never used  Substance Use Topics   Alcohol use: Not Currently   Drug use: Not Currently     Allergies  Allergen Reactions   Almond (Diagnostic)    Apple Itching   Daucus Carota Itching   Pollen Extract Itching    Current Outpatient Medications  Medication Sig Dispense Refill   FLUoxetine (PROZAC) 10 MG tablet Take 10 mg by mouth daily.     No current facility-administered medications for this visit.    OBJECTIVE: Vitals:   07/25/22 1051  BP: 101/66  Pulse: 65  Resp: 18  Temp: 97.8 F (36.6 C)     Body mass index is 20.89 kg/m.      General: Well-developed, well-nourished, no acute distress. Eyes: Pink conjunctiva, anicteric sclera. HEENT: Normocephalic, moist mucous membranes, clear oropharnyx. Lungs: Clear to auscultation bilaterally. Heart: Regular  rate and rhythm. No rubs, murmurs, or gallops. Abdomen: Soft, nontender, nondistended. No organomegaly noted, normoactive bowel sounds. Musculoskeletal: No edema, cyanosis, or clubbing. Neuro: Alert, answering all questions appropriately. Cranial nerves grossly intact. Skin: No rashes or petechiae noted. Psych: Normal affect. Lymphatics: No cervical, calvicular, axillary or inguinal LAD.   LAB RESULTS:  No results found for: "NA", "K", "CL", "CO2", "GLUCOSE", "BUN", "CREATININE", "CALCIUM", "PROT", "ALBUMIN",  "AST", "ALT", "ALKPHOS", "BILITOT", "GFRNONAA", "GFRAA"  Lab Results  Component Value Date   WBC 2.6 (L) 07/25/2022   NEUTROABS 1.2 (L) 07/25/2022   HGB 12.5 07/25/2022   HCT 36.3 07/25/2022   MCV 86.6 07/25/2022   PLT 190 07/25/2022    No results found for: "TIBC", "FERRITIN", "IRONPCTSAT"   STUDIES: No results found.  ASSESSMENT AND PLAN:   Cynthia Cherry is a 27 y.o. female with pmh of H. pylori infection in 2020, depression was referred to hematology for further workup of leukopenia.  #Leukopenia -Of unknown etiology  -She has leukopenia dating back to 2020.  Prior to 2020, WBC was normal.  ANC has always been above 1000.  Only medication she is on is fluoxetine.  Denies any over-the-counter or herbal supplements.  -Workup so far has been negative.  Vitamin B12/folate normal.  Hepatitis B/C/HIV negative.  Flow cytometry could not assess B-cell clonality due to nonspecific light chain binding but no specific immunophenotypic abnormality was seen. Will repeat testing. CMP normal. ANA was positive and was seen by rheumatology Dr. Posey Pronto.  Reviewed extensive autoimmune workup which was all negative.  Repeat ANA was also negative.  -Discussed with the patient that the cause for leukopenia and neutropenia is not apparent.  All the workup has been negative.  However [place] has always been more than 1000 and has been present since 2020 without any complications.  Would like to hold off on bone marrow biopsy.  No indication for G-CSF support.  Will repeat lab work in 6 months.  #Iron deficiency -Could be from her heavy menstrual period -Ferritin 8 but saturation 57% which could have been due to the iron pills.  She received IV Feraheme one-time.  Will repeat iron panel today.  Has taken oral iron in the past with no side effects.  Patient reports frequent infections as soon as she comes in contact with a sick person.  Masking helps.  But she would like to have immunoglobulins  checked.  Orders Placed This Encounter  Procedures   CBC with Differential   Iron and TIBC   Ferritin   RTC in 6 months for md visit, labs   Patient expressed understanding and was in agreement with this plan. She also understands that She can call clinic at any time with any questions, concerns, or complaints.   I spent a total of 30 minutes reviewing chart data, face-to-face evaluation with the patient, counseling and coordination of care as detailed above.  Jane Canary, MD   07/25/2022 12:39 PM

## 2022-07-25 NOTE — Progress Notes (Signed)
Patient here today for follow up regarding neutropenia.

## 2022-07-26 ENCOUNTER — Encounter: Payer: Self-pay | Admitting: Internal Medicine

## 2022-07-27 LAB — IMMUNOGLOBULINS A/E/G/M, SERUM
IgA: 149 mg/dL (ref 87–352)
IgE (Immunoglobulin E), Serum: 378 IU/mL (ref 6–495)
IgG (Immunoglobin G), Serum: 1408 mg/dL (ref 586–1602)
IgM (Immunoglobulin M), Srm: 315 mg/dL — ABNORMAL HIGH (ref 26–217)

## 2022-07-30 ENCOUNTER — Encounter: Payer: Self-pay | Admitting: Internal Medicine

## 2022-08-23 ENCOUNTER — Telehealth: Payer: Self-pay

## 2022-08-23 NOTE — Telephone Encounter (Signed)
Left message for pt to call office back regarding vm left.

## 2022-09-02 ENCOUNTER — Other Ambulatory Visit (HOSPITAL_COMMUNITY)
Admission: RE | Admit: 2022-09-02 | Discharge: 2022-09-02 | Disposition: A | Discharge status: Home / Self Care | Payer: Managed Care, Other (non HMO) | Source: Ambulatory Visit | Attending: Family Medicine | Admitting: Family Medicine

## 2022-09-02 ENCOUNTER — Encounter: Payer: Self-pay | Admitting: Family Medicine

## 2022-09-02 ENCOUNTER — Ambulatory Visit: Payer: Managed Care, Other (non HMO) | Admitting: Family Medicine

## 2022-09-02 VITALS — BP 94/59 | HR 66 | Wt 131.0 lb

## 2022-09-02 DIAGNOSIS — Z01419 Encounter for gynecological examination (general) (routine) without abnormal findings: Secondary | ICD-10-CM | POA: Insufficient documentation

## 2022-09-02 DIAGNOSIS — N946 Dysmenorrhea, unspecified: Secondary | ICD-10-CM

## 2022-09-02 DIAGNOSIS — N942 Vaginismus: Secondary | ICD-10-CM | POA: Diagnosis not present

## 2022-09-02 DIAGNOSIS — N921 Excessive and frequent menstruation with irregular cycle: Secondary | ICD-10-CM

## 2022-09-02 NOTE — Progress Notes (Signed)
GYNECOLOGY ANNUAL PREVENTATIVE CARE ENCOUNTER NOTE  Subjective:   Cynthia Cherry is a 27 y.o. G0P0000 female here for a routine annual gynecologic exam.  Current complaints:   Concern for endometriosis - Patient with longstanding history of painful crampy periods. Reports heavy bleeding for several days but cycles are < 10 days.  - Associated nausea with cycle.  - has been on COC but not continuous dosing - never had IUD - No family history of endometriosis  Denies abnormal vaginal bleeding, discharge, pelvic pain, problems with intercourse or other gynecologic concerns.    Gynecologic History Patient's last menstrual period was 08/18/2022 (approximate). Contraception: abstinence Last Pap: 2021. Results were: normal Last mammogram: NA  Health Maintenance Due  Topic Date Due   DTaP/Tdap/Td (1 - Tdap) Never done   INFLUENZA VACCINE  Never done   COVID-19 Vaccine (3 - 2023-24 season) 03/04/2022   PAP-Cervical Cytology Screening  07/14/2022   PAP SMEAR-Modifier  07/14/2022    The following portions of the patient's history were reviewed and updated as appropriate: allergies, current medications, past family history, past medical history, past social history, past surgical history and problem list.  Review of Systems Pertinent items are noted in HPI.   Objective:  BP (!) 94/59   Pulse 66   Wt 131 lb (59.4 kg)   LMP 08/18/2022 (Approximate)   BMI 20.52 kg/m  CONSTITUTIONAL: Well-developed, well-nourished female in no acute distress.  HENT:  Normocephalic, atraumatic, External right and left ear normal. Oropharynx is clear and moist EYES:  No scleral icterus.  NECK: Normal range of motion, supple, no masses.  Normal thyroid.  SKIN: Skin is warm and dry. No rash noted. Not diaphoretic. No erythema. No pallor. NEUROLOGIC: Alert and oriented to person, place, and time. Normal reflexes, muscle tone coordination. No cranial nerve deficit noted. PSYCHIATRIC: Normal mood  and affect. Normal behavior. Normal judgment and thought content. CARDIOVASCULAR: Normal heart rate noted, regular rhythm. 2+ distal pulses. RESPIRATORY: Effort and breath sounds normal, no problems with respiration noted. BREASTS: deferred ABDOMEN: Soft,  no distention noted.  No tenderness, rebound or guarding.   PELVIC: Normal appearing external genitalia; normal appearing vaginal mucosa and cervix.  No abnormal discharge noted.  Pap smear obtained.  Normal uterine size, no other palpable masses, no uterine or adnexal tenderness. Chaperone present for exam. +levator spasm bilaterally but more painful on the right side.   MUSCULOSKELETAL: Normal range of motion.    Assessment and Plan:  1) Annual gynecologic examination with pap smear:  Will follow up results of pap smear and manage accordingly. STI screen also ordered today.  Routine preventative health maintenance measures emphasized.  2) Contraception counseling: Does not want method now  1. Well woman exam with routine gynecological exam - Cytology - PAP  2. Dysmenorrhea Offered presumptive treatment with continuous hormonal therapy, Depo, pills, IUD, she declined at this time. Discussed newer therapies such as Orilissa. Discussed briefly surgical evaluation and management, but emphasized that hormonal therapy is used after surgery to help prevent further lesions/treat lesions She is not interested in any of these modalities for now.  Since being seen in 2022 she has made lifestyle changes that have helped with severity of symptoms Recommended Period Repair Manual for further diet/inflammation reduction techniques  Declines antinausea medication at this time.   3. Menometrorrhagia  4. Vaginismus +levator spasm R>L. Discsused techniques to stretch and reduce pain and demonstrated during pelvic exam. She would like to try home therapy/stretches and if not improving would  like referral to Pelvic Floor PT.   Please refer to After  Visit Summary for other counseling recommendations.   Return in about 1 year (around 09/02/2023) for Yearly wellness exam.  Caren Macadam, MD, MPH, ABFM Attending Physician Center for [facility]

## 2022-09-02 NOTE — Progress Notes (Signed)
CC: Last pap 07/15/19  Denies any abnormal pap  STD Screening: Yes

## 2022-09-06 ENCOUNTER — Telehealth: Payer: Self-pay

## 2022-09-06 LAB — CYTOLOGY - PAP
Chlamydia: NEGATIVE
Comment: NEGATIVE
Comment: NEGATIVE
Comment: NORMAL
Diagnosis: NEGATIVE
Diagnosis: REACTIVE
Neisseria Gonorrhea: NEGATIVE
Trichomonas: NEGATIVE

## 2022-09-06 NOTE — Telephone Encounter (Addendum)
-----   Message from Caren Macadam, MD sent at 09/06/2022  3:31 PM EST ----- NIL, next in 3 years  Called pt; results reviewed.

## 2022-09-26 ENCOUNTER — Ambulatory Visit: Payer: Managed Care, Other (non HMO) | Admitting: Orthopedic Surgery

## 2022-09-28 NOTE — Progress Notes (Unsigned)
Referring Physician:  Defoor, Satira Anis, PA-C [place],  [place] [address]  Primary Physician:  Marinda Elk, MD  History of Present Illness: 09/29/2022 Ms. Cynthia Cherry has a history of scoliosis, depression, and GERD.   Has been seeing rheumatology at [facility] and was referred here for scoliosis and muscle tension.   She has intermittent right shoulder pain into her shoulder blade x 4-5 months. No left sided pain. She does not have radiating pain into her arm, but notes intermittent right wrist pain. No numbness, tingling, or weakness. Minimal neck pain. No lower back pain. She had some improvement with PT. No known injury.   Conservative measures:  Physical therapy: in PT at [facility]- 5 visits from  08/10/22-09/14/22 and was discharged Multimodal medical therapy including regular antiinflammatories: tylenol  Injections: has not received epidural steroid injections  Past Surgery: no prior spine surgery  Cynthia Cherry has no symptoms of cervical myelopathy.  The symptoms are causing a significant impact on the patient's life.   Review of Systems:  A 10 point review of systems is negative, except for the pertinent positives and negatives detailed in the HPI.  Past Medical History: Past Medical History:  Diagnosis Date   Anxiety    Depression    Gastritis    Helicobacter pylori (H. pylori) infection 2020   Treated with anbx in 2020    Past Surgical History: Past Surgical History:  Procedure Laterality Date   OTHER SURGICAL HISTORY  2010?   Reports needing to be "opened up" to have period when in high school. Since this procedure she has had regular menses. Unsure if this was inperforate hymen or cervical stenosis   POLYPECTOMY      Allergies: Allergies as of 09/29/2022 - Review Complete 09/29/2022  Allergen Reaction Noted   Almond (diagnostic)  05/30/2022   Almond oil Itching 08/08/2019   Apple Itching 08/08/2019   Daucus carota Itching  08/08/2019   Other Itching 05/05/2022   Pollen extract Itching and Other (See Comments) 08/08/2019   Short ragweed pollen ext  09/29/2022    Medications: Outpatient Encounter Medications as of 09/29/2022  Medication Sig   FLUoxetine (PROZAC) 10 MG tablet Take 10 mg by mouth daily.   No facility-administered encounter medications on file as of 09/29/2022.    Social History: Social History   Tobacco Use   Smoking status: Never   Smokeless tobacco: Never  Vaping Use   Vaping Use: Never used  Substance Use Topics   Alcohol use: Not Currently   Drug use: Not Currently    Family Medical History: Family History  Problem Relation Age of Onset   Cancer Sister    Cancer Paternal Uncle    Cancer Maternal Grandfather    Endometriosis Neg Hx    Fibroids Neg Hx    Diabetes Neg Hx     Physical Examination: Vitals:   09/29/22 1332  BP: 102/70    General: Patient is well developed, well nourished, calm, collected, and in no apparent distress. Attention to examination is appropriate.  Respiratory: Patient is breathing without any difficulty.   NEUROLOGICAL:     Awake, alert, oriented to person, place, and time.  Speech is clear and fluent. Fund of knowledge is appropriate.   Cranial Nerves: Pupils equal round and reactive to light.  Facial tone is symmetric.    No posterior cervical tenderness. No tenderness in bilateral trapezial region. No scapular tenderness is noted.   No thoracic tenderness noted.  No pain with ROM of right shoulder. No pain with stress of rotator cuff. No weakness noted.   No abnormal lesions on exposed skin.   Strength: Side Biceps Triceps Deltoid Interossei Grip Wrist Ext. Wrist Flex.  R 5 5 5 5 5 5 5   L 5 5 5 5 5 5 5    Side Iliopsoas Quads Hamstring PF DF EHL  R 5 5 5 5 5 5   L 5 5 5 5 5 5    Reflexes are 2+ and symmetric at the biceps, triceps, brachioradialis, patella and achilles.   Hoffman's is absent.  Clonus is not present.    Bilateral upper and lower extremity sensation is intact to light touch.     Gait is normal.     Medical Decision Making  Imaging: Full length scoliosis xrays dated 06/02/22:  FINDINGS: There are hypoplastic ribs at the vertebral body considered T12. There are 5 non-rib-bearing lumbar-type vertebral bodies.   There is mild dextrocurvature centered at T6-7 measuring 9 degrees. There is minimal levocurvature centered at L1 with Cobb angle measuring 8 degrees.   There is approximately 4.7 cm negative sagittal balance.   Straightening of the normal cervical lordosis. No sagittal spondylolisthesis is seen throughout the spine.   The left iliac crest is 9 mm higher than the right.   Mild degenerative articular surface irregularity of the pubic symphysis. The bilateral sacroiliac and bilateral femoroacetabular joint spaces are maintained.   The visualized chest abdominal soft tissues are unremarkable.   IMPRESSION: 1. Mild dextrocurvature centered at T6-7 with Cobb angle measuring 9 degrees. 2. Minimal levocurvature centered at L1 with Cobb angle measuring 8 degrees. 3. Approximately 4.7 cm negative sagittal balance.     Electronically Signed   By: Yvonne Kendall M.D.   On: 06/04/2022 07:44  I have personally reviewed the images and agree with the above interpretation.  Above xrays reviewed with Dr. Izora Ribas. She does not have scoliosis, however she does have a negative sagittal imbalance.  Assessment and Plan: Cynthia Cherry is a pleasant 27 y.o. female has intermittent right shoulder pain into her shoulder blade x 4-5 months. No left sided pain. She does not have radiating pain into her arm, but notes intermittent right wrist pain. Minimal neck pain. No lower back pain.   Scoliosis xrays show negative sagittal imbalance. I don't think this is contributing to her right shoulder/scapular pain. Pain appears more myofascial in nature, but she does not have any tenderness.    Treatment options discussed with Dr. Izora Ribas and following plan made with patient:   - Continue HEP from PT.  - Referral to Dr. Candelaria Stagers at [facility] for possible injections.  - May consider revisiting PT for dry needling and myofascial release as well.  - She will f/u prn.   I spent a total of 20 minutes in face-to-face and non-face-to-face activities related to this patient's care today including review of outside records, review of imaging, review of symptoms, physical exam, discussion of differential diagnosis, discussion of treatment options, and documentation.   Thank you for involving me in the care of this patient.   Geronimo Boot PA-C Dept. of Neurosurgery

## 2022-09-29 ENCOUNTER — Encounter: Payer: Self-pay | Admitting: Orthopedic Surgery

## 2022-09-29 ENCOUNTER — Ambulatory Visit: Payer: Managed Care, Other (non HMO) | Admitting: Orthopedic Surgery

## 2022-09-29 VITALS — BP 102/70 | Ht 67.0 in | Wt 131.0 lb

## 2022-09-29 DIAGNOSIS — M7918 Myalgia, other site: Secondary | ICD-10-CM

## 2022-09-29 DIAGNOSIS — M25511 Pain in right shoulder: Secondary | ICD-10-CM | POA: Diagnosis not present

## 2022-09-29 NOTE — Patient Instructions (Signed)
It was so nice to see you today. Thank you so much for coming in.    I don't think the pain in your right shoulder blade/shoulder is coming from your spine.   I want you to see ortho at the [facility] clinic for evaluation of your scapular pain. I put in a referral for you to see Dr. Rosalia Hammers. They should call you to schedule an appointment or you can call them at [phone].   Please do not hesitate to call if you have any questions or concerns. You can also message me in [place].    Geronimo Boot PA-C [phone]

## 2022-12-23 ENCOUNTER — Other Ambulatory Visit: Payer: Self-pay

## 2022-12-23 DIAGNOSIS — M542 Cervicalgia: Secondary | ICD-10-CM

## 2022-12-31 ENCOUNTER — Ambulatory Visit: Admission: RE | Admit: 2022-12-31 | Payer: Managed Care, Other (non HMO) | Source: Ambulatory Visit

## 2023-01-23 ENCOUNTER — Inpatient Hospital Stay (HOSPITAL_BASED_OUTPATIENT_CLINIC_OR_DEPARTMENT_OTHER): Payer: Managed Care, Other (non HMO) | Admitting: Internal Medicine

## 2023-01-23 ENCOUNTER — Inpatient Hospital Stay: Payer: Managed Care, Other (non HMO) | Attending: Internal Medicine

## 2023-01-23 VITALS — BP 101/69 | HR 72 | Temp 97.9°F | Wt 128.0 lb

## 2023-01-23 DIAGNOSIS — E611 Iron deficiency: Secondary | ICD-10-CM | POA: Diagnosis not present

## 2023-01-23 DIAGNOSIS — N920 Excessive and frequent menstruation with regular cycle: Secondary | ICD-10-CM | POA: Diagnosis not present

## 2023-01-23 DIAGNOSIS — D709 Neutropenia, unspecified: Secondary | ICD-10-CM

## 2023-01-23 DIAGNOSIS — M25511 Pain in right shoulder: Secondary | ICD-10-CM | POA: Insufficient documentation

## 2023-01-23 DIAGNOSIS — D500 Iron deficiency anemia secondary to blood loss (chronic): Secondary | ICD-10-CM | POA: Diagnosis not present

## 2023-01-23 DIAGNOSIS — D72819 Decreased white blood cell count, unspecified: Secondary | ICD-10-CM | POA: Diagnosis not present

## 2023-01-23 DIAGNOSIS — M25531 Pain in right wrist: Secondary | ICD-10-CM | POA: Insufficient documentation

## 2023-01-23 LAB — CBC WITH DIFFERENTIAL/PLATELET
Abs Immature Granulocytes: 0 10*3/uL (ref 0.00–0.07)
Basophils Absolute: 0 10*3/uL (ref 0.0–0.1)
Basophils Relative: 1 %
Eosinophils Absolute: 0 10*3/uL (ref 0.0–0.5)
Eosinophils Relative: 1 %
HCT: 41.4 % (ref 36.0–46.0)
Hemoglobin: 13.5 g/dL (ref 12.0–15.0)
Immature Granulocytes: 0 %
Lymphocytes Relative: 39 %
Lymphs Abs: 1.1 10*3/uL (ref 0.7–4.0)
MCH: 29.2 pg (ref 26.0–34.0)
MCHC: 32.6 g/dL (ref 30.0–36.0)
MCV: 89.4 fL (ref 80.0–100.0)
Monocytes Absolute: 0.2 10*3/uL (ref 0.1–1.0)
Monocytes Relative: 6 %
Neutro Abs: 1.5 10*3/uL — ABNORMAL LOW (ref 1.7–7.7)
Neutrophils Relative %: 53 %
Platelets: 190 10*3/uL (ref 150–400)
RBC: 4.63 MIL/uL (ref 3.87–5.11)
RDW: 13.1 % (ref 11.5–15.5)
WBC: 2.9 10*3/uL — ABNORMAL LOW (ref 4.0–10.5)
nRBC: 0 % (ref 0.0–0.2)

## 2023-01-23 LAB — FERRITIN: Ferritin: 10 ng/mL — ABNORMAL LOW (ref 11–307)

## 2023-01-23 LAB — IRON AND TIBC
Iron: 46 ug/dL (ref 28–170)
Saturation Ratios: 14 % (ref 10.4–31.8)
TIBC: 332 ug/dL (ref 250–450)
UIBC: 286 ug/dL

## 2023-01-26 LAB — COMP PANEL: LEUKEMIA/LYMPHOMA

## 2023-01-27 ENCOUNTER — Encounter: Payer: Self-pay | Admitting: Internal Medicine

## 2023-01-27 NOTE — Progress Notes (Signed)
[facility]  [phone] [phone]  ID: Cynthia Cherry OB: [birth year]  MR#: [id number]  AOZ#:[id number]  Patient Care Team: Patrice Paradise, MD as PCP - General (Physician Assistant)   HPI: Cynthia Cherry is a 27 y.o. female with past medical history of H. pylori infection in 2020, depression was referred to hematology for further workup of leukopenia.  Patient reports increased infections primarily colds since 2020.  Denies any pneumonias or hospitalizations.  She gets colds easily when exposed to sick people. There is no pattern to it. It depends on the exposure. Denies any fevers, night sweats, weight loss, shortness of breath, chest pain.  She has heavy menstrual period requiring frequent pad changes on initial 3 to 4 days, lasts total of 7 days.  Has history of iron deficiency and has been on oral iron for years.  ANA was positive and she has been referred to rheumatology in [place] clinic by PCP.  She has intermittent right shoulder and wrist pain.  Otherwise denies any other joint pains, oral ulcers, rash, Raynaud's, photosensitivity..   Labs reviewed. CBC with differential showed WBC 2.6, hemoglobin 12.4, platelet 169, ANC 1.3.  She has leukopenia dating back to 2020.  B12 > 1500.  Ferritin 8, saturation 57%.  INTERVAL HISTORY-  Patient was seen today to discuss the lab results. Feeling well overall. Continues to have heavy menstrual cycle.  REVIEW OF SYSTEMS:   Review of Systems  Musculoskeletal:  Positive for joint pain.  Neurological:  Positive for headaches.    As per HPI. Otherwise, a complete review of systems is negative.  PAST MEDICAL HISTORY: Past Medical History:  Diagnosis Date   Anxiety    Depression    Gastritis    Helicobacter pylori (H. pylori) infection 2020   Treated with anbx in 2020    PAST SURGICAL HISTORY: Past Surgical History:  Procedure Laterality Date   OTHER SURGICAL HISTORY  2010?    Reports needing to be "opened up" to have period when in high school. Since this procedure she has had regular menses. Unsure if this was inperforate hymen or cervical stenosis   POLYPECTOMY      FAMILY HISTORY: Family History  Problem Relation Age of Onset   Cancer Sister    Cancer Paternal Uncle    Cancer Maternal Grandfather    Endometriosis Neg Hx    Fibroids Neg Hx    Diabetes Neg Hx     HEALTH MAINTENANCE: Social History   Tobacco Use   Smoking status: Never   Smokeless tobacco: Never  Vaping Use   Vaping status: Never Used  Substance Use Topics   Alcohol use: Not Currently   Drug use: Not Currently     Allergies  Allergen Reactions   Almond (Diagnostic)    Almond Oil Itching   Apple Itching   Daucus Carota Itching   Other Itching   Pollen Extract Itching and Other (See Comments)    Itching, sneezing   Short Ragweed Pollen Ext     Current Outpatient Medications  Medication Sig Dispense Refill   FLUoxetine (PROZAC) 10 MG tablet Take 10 mg by mouth daily.     No current facility-administered medications for this visit.    OBJECTIVE: Vitals:   01/23/23 1056  BP: 101/69  Pulse: 72  Temp: 97.9 F (36.6 C)  SpO2: 100%     Body mass index is 20.05 kg/m.      General: Well-developed, well-nourished, no acute distress. Eyes: Pink conjunctiva, anicteric  sclera. HEENT: Normocephalic, moist mucous membranes, clear oropharnyx. Lungs: Clear to auscultation bilaterally. Heart: Regular rate and rhythm. No rubs, murmurs, or gallops. Abdomen: Soft, nontender, nondistended. No organomegaly noted, normoactive bowel sounds. Musculoskeletal: No edema, cyanosis, or clubbing. Neuro: Alert, answering all questions appropriately. Cranial nerves grossly intact. Skin: No rashes or petechiae noted. Psych: Normal affect. Lymphatics: No cervical, calvicular, axillary or inguinal LAD.   LAB RESULTS:  No results found for: "NA", "K", "CL", "CO2", "GLUCOSE", "BUN",  "CREATININE", "CALCIUM", "PROT", "ALBUMIN", "AST", "ALT", "ALKPHOS", "BILITOT", "GFRNONAA", "GFRAA"  Lab Results  Component Value Date   WBC 2.9 (L) 01/23/2023   NEUTROABS 1.5 (L) 01/23/2023   HGB 13.5 01/23/2023   HCT 41.4 01/23/2023   MCV 89.4 01/23/2023   PLT 190 01/23/2023    Lab Results  Component Value Date   TIBC 332 01/23/2023   TIBC 311 07/25/2022   FERRITIN 10 (L) 01/23/2023   FERRITIN 40 07/25/2022   IRONPCTSAT 14 01/23/2023   IRONPCTSAT 28 07/25/2022     STUDIES: No results found.  ASSESSMENT AND PLAN:   Cynthia Cherry is a 27 y.o. female with pmh of H. pylori infection in 2020, depression was referred to hematology for further workup of leukopenia.  #Leukopenia -Of unknown etiology  -She has leukopenia dating back to 2020.  Prior to 2020, WBC was normal.  ANC has always been above 1000.  Only medication she is on is fluoxetine.  Denies any over-the-counter or herbal supplements.  -Workup so far has been negative.  Vitamin B12/folate normal.  Hepatitis B/C/HIV negative.  Flow cytometry could not assess B-cell clonality due to nonspecific light chain binding but no specific immunophenotypic abnormality was seen.  Repeat flow cytometry again showed nonspecific light chain binding.  ANA was positive and was seen by rheumatology Dr. Allena Katz.  Reviewed extensive autoimmune workup which was all negative.  Repeat ANA was also negative.  -Her leukopenia is stable.  Will hold off on bone marrow biopsy.  No indication for G-CSF support.  ANC has always been more than 1000.  Denies any frequent infection.  Continue with surveillance.  She is scheduled to follow-up with rheumatology in 6 months with repeat ANA.  #Iron deficiency -Could be from her heavy menstrual period -Repeat iron studies today.  If low, we will schedule her for IV Feraheme weekly x 2.  Continue with oral iron daily.  Orders Placed This Encounter  Procedures   CBC with Differential/Platelet   Iron  and TIBC   Ferritin   RTC in 9 months for md visit, labs   Patient expressed understanding and was in agreement with this plan. She also understands that She can call clinic at any time with any questions, concerns, or complaints.   I spent a total of 25 minutes reviewing chart data, face-to-face evaluation with the patient, counseling and coordination of care as detailed above.  Michaelyn Barter, MD   01/27/2023 10:17 AM

## 2023-01-31 ENCOUNTER — Encounter: Payer: Self-pay | Admitting: Internal Medicine

## 2023-01-31 NOTE — Addendum Note (Signed)
Addended byMichaelyn Barter on: 01/31/2023 10:25 AM   Modules accepted: Orders

## 2023-07-23 IMAGING — US US PELVIS COMPLETE WITH TRANSVAGINAL
1 series · 13 of 25 positions shown · non-contrast
Comparison: None

CLINICAL DATA: Abnormal uterine bleeding and associated
dysmenorrhea, concern for possible endometriosis; LMP 03/21/2021



[Series 1: us pelvic complete with transvaginal · 13 of 88 slices shown]
[im 1/88]
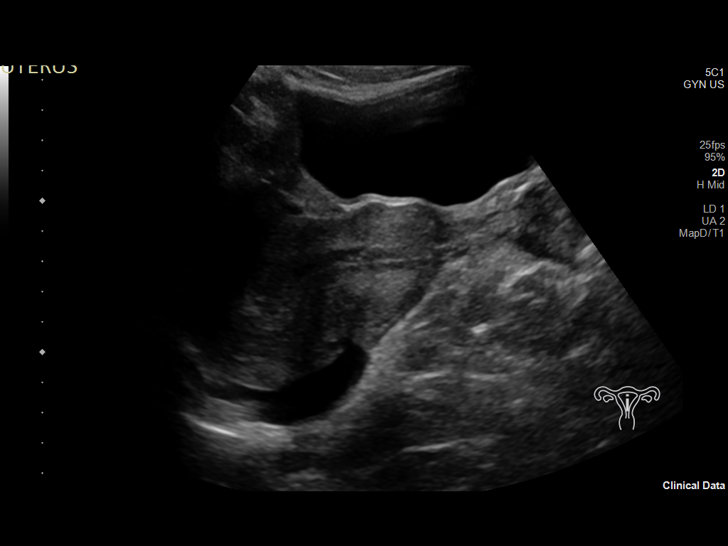
[im 8/88]
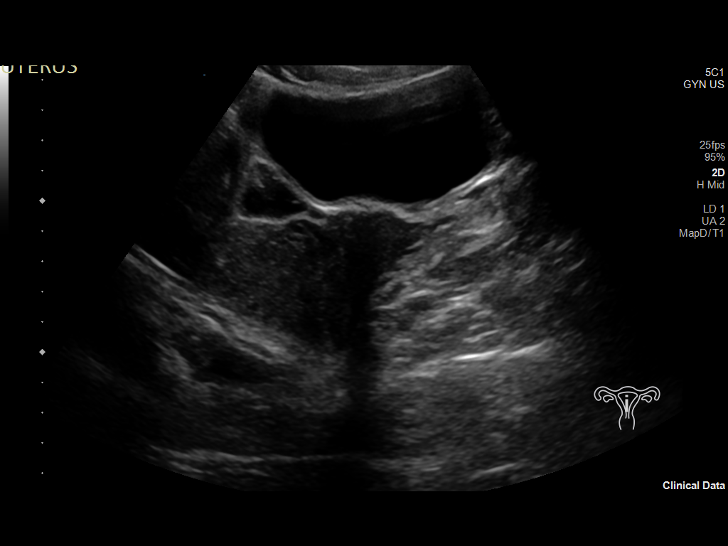
[im 15/88]
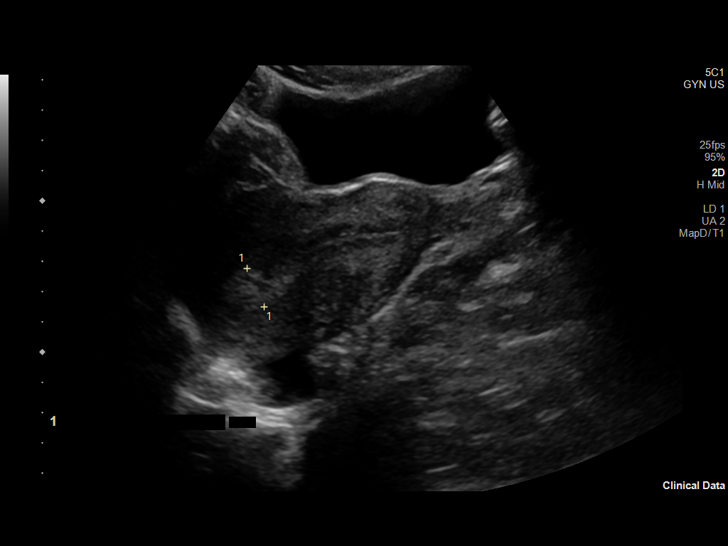
[im 22/88]
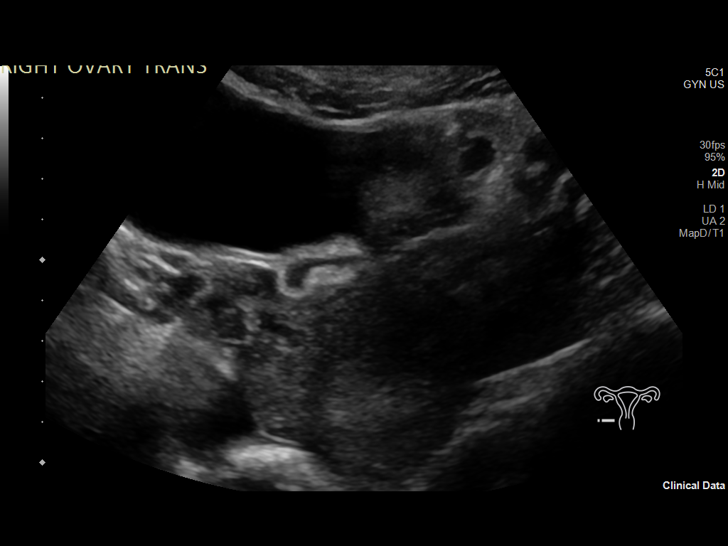
[im 30/88]
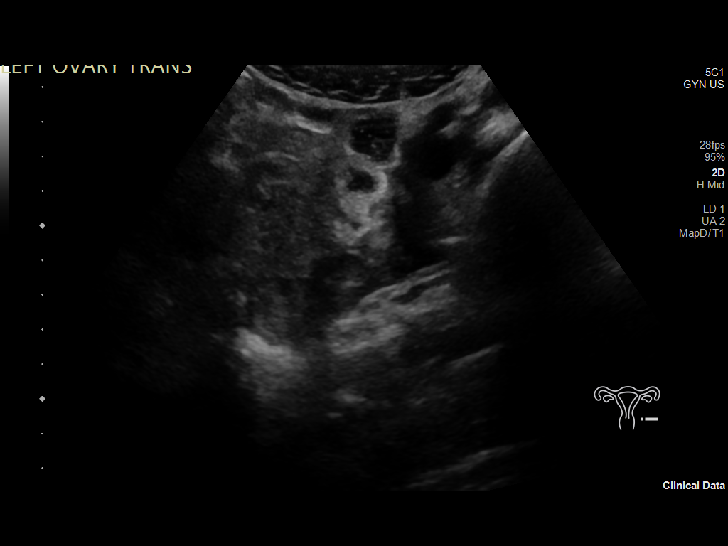
[im 37/88]
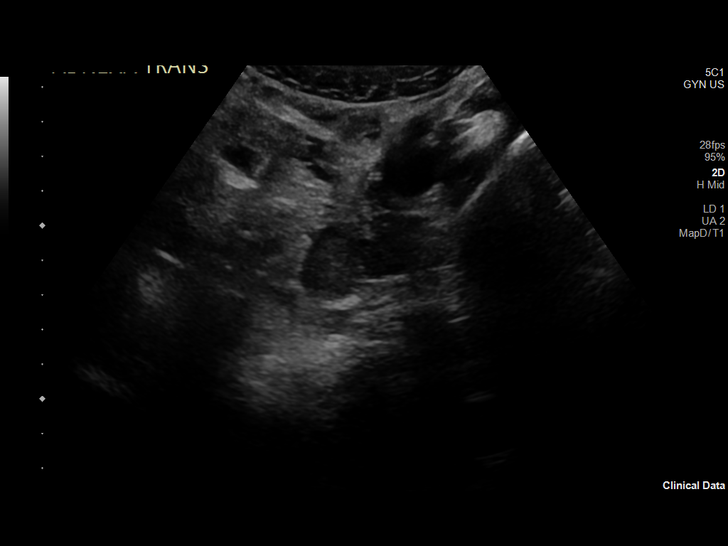
[im 44/88]
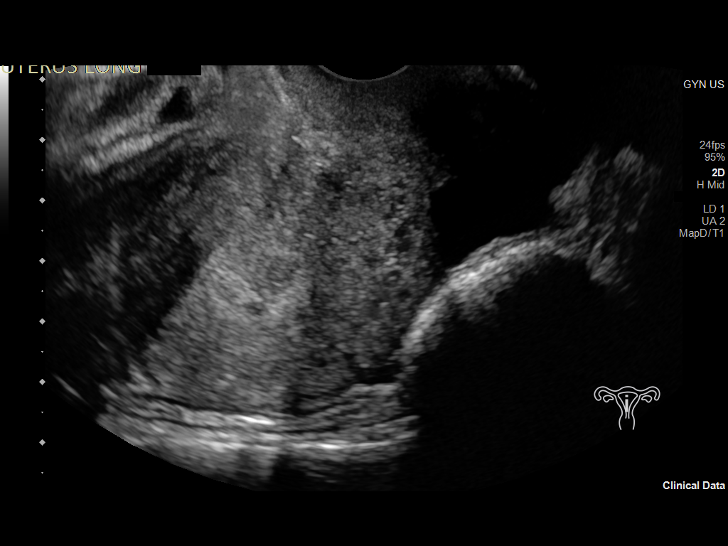
[im 51/88]
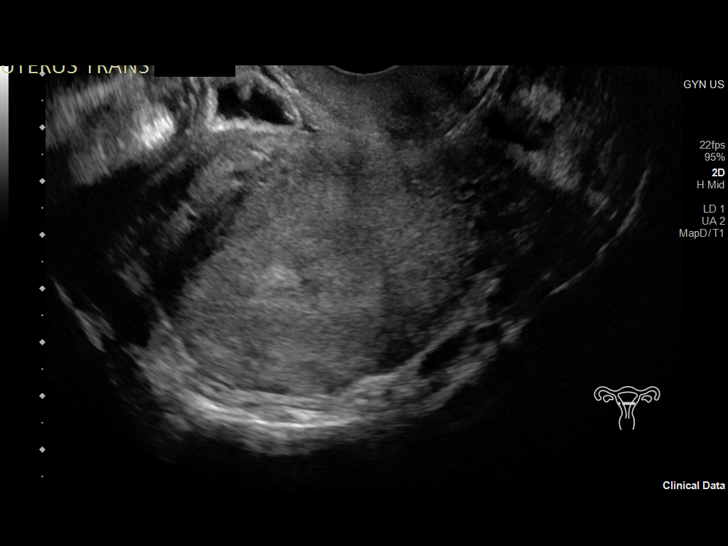
[im 59/88]
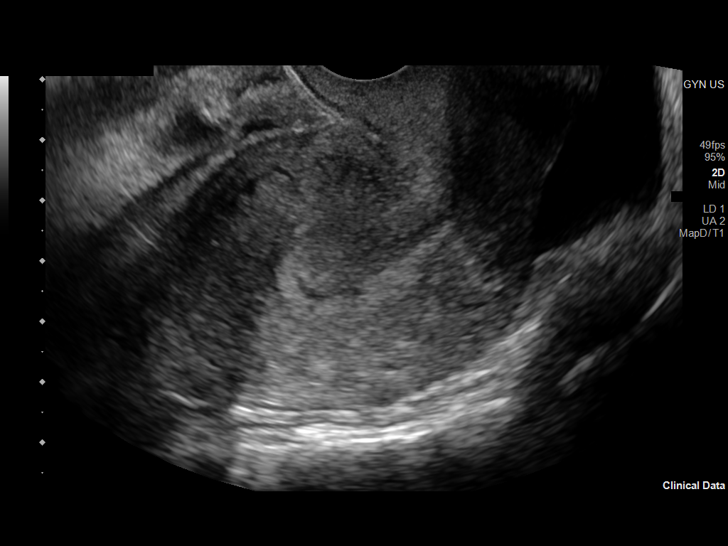
[im 66/88]
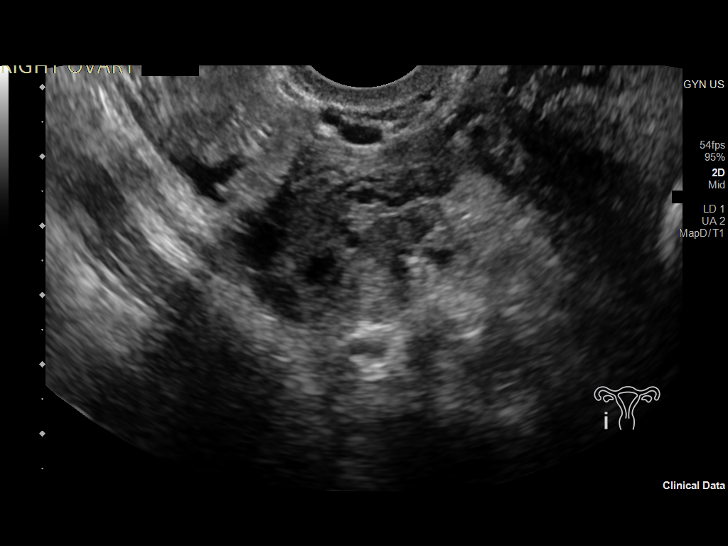
[im 73/88]
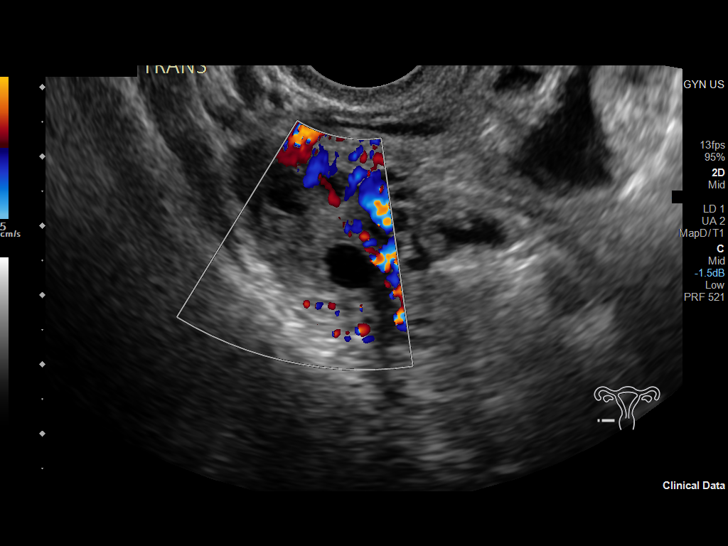
[im 80/88]
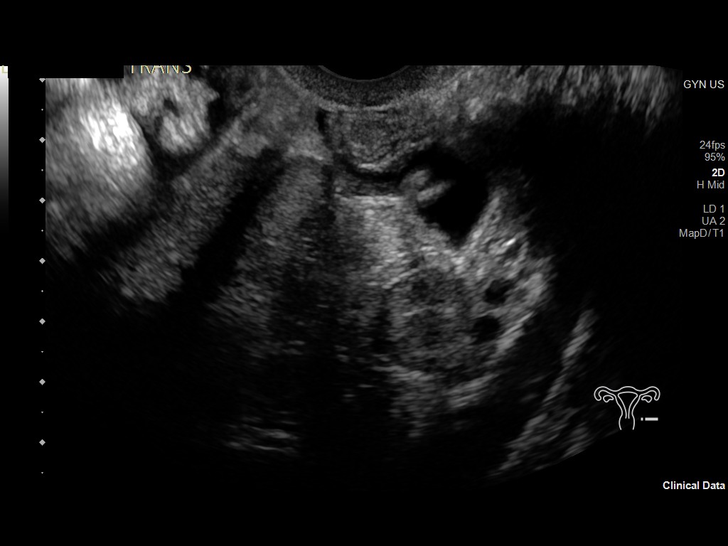
[im 88/88]
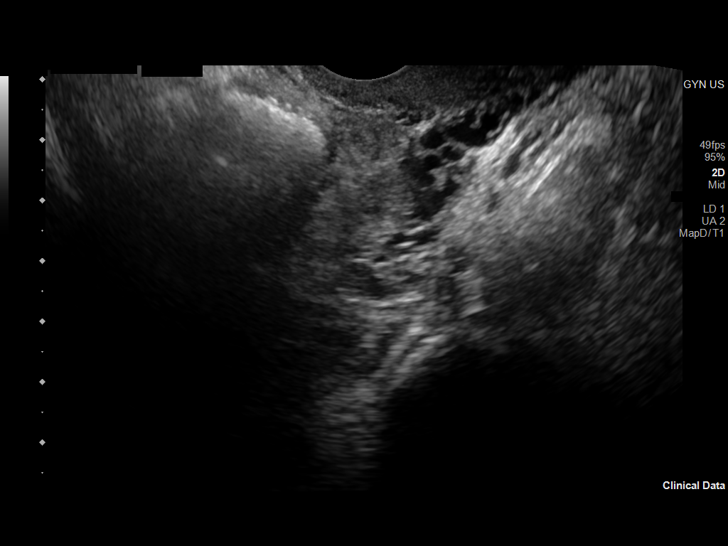

[13 of 25 positions shown; findings below may reference images not displayed]

FINDINGS: Uterus

Measurements: 8.2 x 5.0 x 5.7 cm = volume: 120 mL. Anteverted and
slightly anteflexed. Normal morphology without mass.

Endometrium

Thickness: 12 mm. Mildly heterogeneous. No polyp/mass. Small amount
of nonspecific endometrial fluid.

Right ovary

Measurements: 3.4 x 1.1 x 1.8 cm = volume: 3.4 mL. Normal morphology
without mass

Left ovary

Measurements: 2.6 x 1.6 x 1.9 cm = volume: 4.1 mL. Normal morphology
without mass

Other findings

Small amount of nonspecific free pelvic fluid.  No adnexal masses.
IMPRESSION: Small amount of nonspecific endometrial fluid.

Nonspecific free pelvic fluid.

Otherwise negative exam.

## 2023-09-13 ENCOUNTER — Telehealth: Payer: Self-pay | Admitting: Internal Medicine

## 2023-09-13 ENCOUNTER — Other Ambulatory Visit: Payer: Self-pay | Admitting: *Deleted

## 2023-09-13 DIAGNOSIS — E559 Vitamin D deficiency, unspecified: Secondary | ICD-10-CM

## 2023-09-13 NOTE — Telephone Encounter (Signed)
 Pt called and requested to add vitamin D to her labs. Please advise

## 2023-09-13 NOTE — Telephone Encounter (Signed)
 Vit d added to pt's orders for tom.

## 2023-09-14 ENCOUNTER — Encounter: Payer: Self-pay | Admitting: Internal Medicine

## 2023-09-14 ENCOUNTER — Inpatient Hospital Stay (HOSPITAL_BASED_OUTPATIENT_CLINIC_OR_DEPARTMENT_OTHER): Admitting: Internal Medicine

## 2023-09-14 ENCOUNTER — Inpatient Hospital Stay: Attending: Internal Medicine

## 2023-09-14 VITALS — BP 104/68 | HR 80 | Temp 98.0°F | Resp 16 | Wt 127.6 lb

## 2023-09-14 DIAGNOSIS — N920 Excessive and frequent menstruation with regular cycle: Secondary | ICD-10-CM | POA: Diagnosis not present

## 2023-09-14 DIAGNOSIS — D72819 Decreased white blood cell count, unspecified: Secondary | ICD-10-CM | POA: Diagnosis present

## 2023-09-14 DIAGNOSIS — D500 Iron deficiency anemia secondary to blood loss (chronic): Secondary | ICD-10-CM

## 2023-09-14 DIAGNOSIS — D709 Neutropenia, unspecified: Secondary | ICD-10-CM

## 2023-09-14 DIAGNOSIS — E611 Iron deficiency: Secondary | ICD-10-CM | POA: Diagnosis not present

## 2023-09-14 DIAGNOSIS — E559 Vitamin D deficiency, unspecified: Secondary | ICD-10-CM | POA: Diagnosis not present

## 2023-09-14 DIAGNOSIS — D704 Cyclic neutropenia: Secondary | ICD-10-CM | POA: Diagnosis not present

## 2023-09-14 LAB — IRON AND TIBC
Iron: 71 ug/dL (ref 28–170)
Saturation Ratios: 22 % (ref 10.4–31.8)
TIBC: 322 ug/dL (ref 250–450)
UIBC: 251 ug/dL

## 2023-09-14 LAB — CBC WITH DIFFERENTIAL/PLATELET
Abs Immature Granulocytes: 0.01 10*3/uL (ref 0.00–0.07)
Basophils Absolute: 0 10*3/uL (ref 0.0–0.1)
Basophils Relative: 1 %
Eosinophils Absolute: 0 10*3/uL (ref 0.0–0.5)
Eosinophils Relative: 1 %
HCT: 35.9 % — ABNORMAL LOW (ref 36.0–46.0)
Hemoglobin: 11.8 g/dL — ABNORMAL LOW (ref 12.0–15.0)
Immature Granulocytes: 0 %
Lymphocytes Relative: 28 %
Lymphs Abs: 0.9 10*3/uL (ref 0.7–4.0)
MCH: 29.9 pg (ref 26.0–34.0)
MCHC: 32.9 g/dL (ref 30.0–36.0)
MCV: 91.1 fL (ref 80.0–100.0)
Monocytes Absolute: 0.3 10*3/uL (ref 0.1–1.0)
Monocytes Relative: 9 %
Neutro Abs: 1.9 10*3/uL (ref 1.7–7.7)
Neutrophils Relative %: 61 %
Platelets: 162 10*3/uL (ref 150–400)
RBC: 3.94 MIL/uL (ref 3.87–5.11)
RDW: 13.3 % (ref 11.5–15.5)
WBC: 3.1 10*3/uL — ABNORMAL LOW (ref 4.0–10.5)
nRBC: 0 % (ref 0.0–0.2)

## 2023-09-14 LAB — FERRITIN: Ferritin: 11 ng/mL (ref 11–307)

## 2023-09-14 LAB — VITAMIN D 25 HYDROXY (VIT D DEFICIENCY, FRACTURES): Vit D, 25-Hydroxy: 38.66 ng/mL (ref 30–100)

## 2023-09-14 NOTE — Patient Instructions (Signed)
 Please consider oral iron supplements over the counter 3 times a week.   Continue with follow up with your PCP for regular CBC and iron levels testing once or twice a year. If neutropil counts drop below 1000 or there is concern for frequent infections, to be referred back to Hematology.

## 2023-09-14 NOTE — Progress Notes (Signed)
 Patient concerned with low iron.

## 2023-09-14 NOTE — Progress Notes (Signed)
 [facility]  [phone]) [phone] [phone]) [phone]  ID: Cynthia Cherry OB: 1995-12-30  MR#: [id number]  AOZ#:[id number]  Patient Care Team: Patrice Paradise, MD as PCP - General (Physician Assistant) Michaelyn Barter, MD as Consulting Physician (Oncology)  Reason for visit-cyclic neutropenia, low ferritin HPI: Cynthia Cherry is a 28 y.o. female with past medical history of H. pylori infection in 2020, depression was referred to hematology for further workup of leukopenia.  Patient reports increased infections primarily colds since 2020.  Denies any pneumonias or hospitalizations.  She gets colds easily when exposed to sick people. There is no pattern to it. It depends on the exposure. Denies any fevers, night sweats, weight loss, shortness of breath, chest pain.  She has heavy menstrual period requiring frequent pad changes on initial 3 to 4 days, lasts total of 7 days.  Has history of iron deficiency and has been on oral iron for years.  ANA was positive and she has been referred to rheumatology in [place] clinic by PCP.  She has intermittent right shoulder and wrist pain.  Otherwise denies any other joint pains, oral ulcers, rash, Raynaud's, photosensitivity..   Patient follows with [facility] rheumatology.  Repeat ANA was negative.  Does not have any diagnosis of autoimmune conditions.  CBC with differential showed WBC 2.6, hemoglobin 12.4, platelet 169, ANC 1.3.  She has leukopenia dating back to 2020.  B12 > 1500.  Ferritin 8, saturation 57%.  INTERVAL HISTORY-  Patient was seen today as follow-up for neutropenia, low ferritin, labs.  Accompanied with mother. For the past few days, she has noticed her left ear more than right feeling clogged and she is having episodes of dizziness.  Does report allergy and sensitivity to things around her.  Denies any frequent infections.  REVIEW OF SYSTEMS:   Review of Systems  Neurological:  Positive for dizziness.     As per HPI. Otherwise, a complete review of systems is negative.  PAST MEDICAL HISTORY: Past Medical History:  Diagnosis Date   Anxiety    Depression    Gastritis    Helicobacter pylori (H. pylori) infection 2020   Treated with anbx in 2020    PAST SURGICAL HISTORY: Past Surgical History:  Procedure Laterality Date   OTHER SURGICAL HISTORY  2010?   Reports needing to be "opened up" to have period when in high school. Since this procedure she has had regular menses. Unsure if this was inperforate hymen or cervical stenosis   POLYPECTOMY      FAMILY HISTORY: Family History  Problem Relation Age of Onset   Cancer Sister    Cancer Paternal Uncle    Cancer Maternal Grandfather    Endometriosis Neg Hx    Fibroids Neg Hx    Diabetes Neg Hx     HEALTH MAINTENANCE: Social History   Tobacco Use   Smoking status: Never   Smokeless tobacco: Never  Vaping Use   Vaping status: Never Used  Substance Use Topics   Alcohol use: Not Currently   Drug use: Not Currently     Allergies  Allergen Reactions   Almond (Diagnostic)    Almond Oil Itching   Apple Itching   Daucus Carota Itching   Other Itching   Pollen Extract Itching and Other (See Comments)    Itching, sneezing   Short Ragweed Pollen Ext     Current Outpatient Medications  Medication Sig Dispense Refill   FLUoxetine (PROZAC) 10 MG tablet Take 10 mg by mouth daily.  OVER THE COUNTER MEDICATION Beef liver tablets     No current facility-administered medications for this visit.    OBJECTIVE: Vitals:   09/14/23 1100  BP: 104/68  Pulse: 80  Resp: 16  Temp: 98 F (36.7 C)     Body mass index is 19.98 kg/m.      General: Well-developed, well-nourished, no acute distress. Eyes: Pink conjunctiva, anicteric sclera. HEENT: Normocephalic, moist mucous membranes, clear oropharnyx. Lungs: Clear to auscultation bilaterally. Heart: Regular rate and rhythm. No rubs, murmurs, or gallops. Abdomen: Soft,  nontender, nondistended. No organomegaly noted, normoactive bowel sounds. Musculoskeletal: No edema, cyanosis, or clubbing. Neuro: Alert, answering all questions appropriately. Cranial nerves grossly intact. Skin: No rashes or petechiae noted. Psych: Normal affect. Lymphatics: No cervical, calvicular, axillary or inguinal LAD.   LAB RESULTS:  No results found for: "NA", "K", "CL", "CO2", "GLUCOSE", "BUN", "CREATININE", "CALCIUM", "PROT", "ALBUMIN", "AST", "ALT", "ALKPHOS", "BILITOT", "GFRNONAA", "GFRAA"  Lab Results  Component Value Date   WBC 3.1 (L) 09/14/2023   NEUTROABS 1.9 09/14/2023   HGB 11.8 (L) 09/14/2023   HCT 35.9 (L) 09/14/2023   MCV 91.1 09/14/2023   PLT 162 09/14/2023    Lab Results  Component Value Date   TIBC 322 09/14/2023   TIBC 332 01/23/2023   TIBC 311 07/25/2022   FERRITIN 11 09/14/2023   FERRITIN 10 (L) 01/23/2023   FERRITIN 40 07/25/2022   IRONPCTSAT 22 09/14/2023   IRONPCTSAT 14 01/23/2023   IRONPCTSAT 28 07/25/2022     STUDIES: No results found.  ASSESSMENT AND PLAN:   Cynthia Cherry is a 28 y.o. female with pmh of H. pylori infection in 2020, depression was referred to hematology for further workup of leukopenia.  #Leukopenia # Neutropenia -She has leukopenia dating back to 2020.  Prior to 2020, WBC was normal.  ANC has always been above 1000.  Only medication she is on is fluoxetine.    -Workup so far has been negative.  Vitamin B12/folate normal.  Hepatitis B/C/HIV negative.  Flow cytometry could not assess B-cell clonality due to nonspecific light chain binding but no specific immunophenotypic abnormality was seen.  Repeat flow cytometry again showed nonspecific light chain binding.  ANA was positive and was seen by rheumatology Dr. Allena Katz.  Reviewed extensive autoimmune workup which was all negative.  Repeat ANA was also negative.  -On lab review, patient has fluctuations in her neutrophil count between 1.2 to normal.  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  Patient denies any history of frequent infections.  She identifies herself as African-American.  There could also be a component of constitutional neutropenia.  Again, does not need intervention at this time.   - She will continue to follow with PCP for CBC check once or twice a year.  If her neutropenia worsens with ANC less than 1000, other cytopenias, frequent infections and other concerning symptoms patient can be referred back to hematology.  #Iron deficiency -Could be from her heavy menstrual period -Hemoglobin is normal at 11.8.  Recent iron studies from December 2024 showed ferritin of 19 and saturation of 18%.  She is taking beef liver supplements.  Also advised to add oral iron supplements 3 times a week with vitamin C.  Iron panel is pending from today.  I will reach out to the patient with the lab results.  # History of vitamin D deficiency - vitamin D level added per  patient's request.  No orders of the defined types were placed in this encounter.  RTC as needed  Patient expressed understanding and was in agreement with this plan. She also understands that She can call clinic at any time with any questions, concerns, or complaints.   I spent a total of 25 minutes reviewing chart data, face-to-face evaluation with the patient, counseling and coordination of care as detailed above.  Michaelyn Barter, MD   09/14/2023 2:29 PM

## 2023-09-15 ENCOUNTER — Telehealth: Payer: Self-pay | Admitting: *Deleted

## 2023-09-15 ENCOUNTER — Ambulatory Visit: Admitting: Internal Medicine

## 2023-09-15 ENCOUNTER — Other Ambulatory Visit

## 2023-09-15 NOTE — Telephone Encounter (Signed)
 Per pt. She called and said that MD called this am and she did not get the call. She would like you to call her to talk about if she is worried about hgb, and hct

## 2023-10-16 ENCOUNTER — Other Ambulatory Visit: Payer: Managed Care, Other (non HMO)

## 2023-10-16 ENCOUNTER — Ambulatory Visit: Payer: Managed Care, Other (non HMO) | Admitting: Internal Medicine

## 2024-01-15 ENCOUNTER — Ambulatory Visit: Admitting: Family Medicine

## 2024-01-15 ENCOUNTER — Encounter: Payer: Self-pay | Admitting: Family Medicine

## 2024-01-15 VITALS — BP 104/66 | HR 86 | Wt 136.0 lb

## 2024-01-15 DIAGNOSIS — Z803 Family history of malignant neoplasm of breast: Secondary | ICD-10-CM

## 2024-01-15 DIAGNOSIS — N83202 Unspecified ovarian cyst, left side: Secondary | ICD-10-CM

## 2024-01-15 DIAGNOSIS — N946 Dysmenorrhea, unspecified: Secondary | ICD-10-CM

## 2024-01-15 NOTE — Progress Notes (Signed)
   GYNECOLOGY PROBLEM  VISIT ENCOUNTER NOTE  Subjective:   Cynthia Cherry is a 28 y.o. G0P0000 female here for a problem GYN visit.  Current complaints: here for follow up on ovarian cyst seen at [facility] clinic. Notes she does not have left side pain. She reports regular cycles every 25-29 days.   I reviewed the report in care everywhere which showed a 25mm complex left cyst (unable to see images).   Menstrual cycles - last < 10 days - they are crampy and painful - Associated with nausea Patient concerned about PCOS and Endometriosis  Denies abnormal vaginal bleeding, discharge, pelvic pain, problems with intercourse or other gynecologic concerns.    Gynecologic History Patient's last menstrual period was 12/24/2023.  Contraception: none  Health Maintenance Due  Topic Date Due   DTaP/Tdap/Td (1 - Tdap) Never done   Pneumococcal Vaccine 39-68 Years old (1 of 2 - PCV) Never done   Hepatitis B Vaccines (1 of 3 - 19+ 3-dose series) Never done   COVID-19 Vaccine (3 - Pfizer risk series) 04/04/2020   HPV VACCINES (1 - 3-dose SCDM series) Never done    The following portions of the patient's history were reviewed and updated as appropriate: allergies, current medications, past family history, past medical history, past social history, past surgical history and problem list.  Review of Systems Pertinent items are noted in HPI.   Objective:  BP 104/66   Pulse 86   Wt 136 lb (61.7 kg)   LMP 12/24/2023   BMI 21.30 kg/m   Gen: well appearing, NAD HEENT: no scleral icterus CV: RR Lung: Normal WOB Ext: warm well perfused  PELVIC: deferred   Assessment and Plan:  1. Dysmenorrhea (Primary) Previously discussed management with COC/IUD Patient would like blood work today Reviewed work up for endometriosis and PCOS - TSH Rfx on Abnormal to Free T4 - Hemoglobin A1c - [facility] Multi-Cancer (2+38) - Ovarian Malignancy Risk-ROMA - Testosterone ,Free and Total  2. Left  ovarian cyst Complex, 2.5cm - Ovarian Malignancy Risk-ROMA - Testosterone ,Free and Total - US  PELVIC COMPLETE WITH TRANSVAGINAL; Future  3. Family history of breast cancer - [facility] Multi-Cancer (2+38)   Please refer to After Visit Summary for other counseling recommendations.   Return in about 3 months (around 04/16/2024) for Follow up US  results, .  Suzen Maryan Masters, MD, MPH, ABFM Attending Physician Faculty Practice- Center for [facility]

## 2024-01-15 NOTE — Progress Notes (Signed)
 RGYN here for problem visit.  LMP: 12/24/23 Monthly Pap: 09/02/2022  WNL   CC: cyst on left side pt had U/S with [facility] in Care everywhere denies any pain.

## 2024-01-17 LAB — OVARIAN MALIGNANCY RISK-ROMA
Cancer Antigen (CA) 125: 17.1 U/mL (ref 0.0–38.1)
HE4: 41 pmol/L (ref 0.0–61.2)
Postmenopausal ROMA: 1.04
Premenopausal ROMA: 0.48

## 2024-01-17 LAB — HEMOGLOBIN A1C
Est. average glucose Bld gHb Est-mCnc: 97 mg/dL
Hgb A1c MFr Bld: 5 % (ref 4.8–5.6)

## 2024-01-17 LAB — POSTMENOPAUSAL INTERP: LOW

## 2024-01-17 LAB — TESTOSTERONE,FREE AND TOTAL
Testosterone, Free: 0.5 pg/mL (ref 0.0–4.2)
Testosterone: 6 ng/dL — ABNORMAL LOW (ref 13–71)

## 2024-01-17 LAB — PREMENOPAUSAL INTERP: LOW

## 2024-01-17 LAB — TSH RFX ON ABNORMAL TO FREE T4: TSH: 1.44 u[IU]/mL (ref 0.450–4.500)

## 2024-01-23 ENCOUNTER — Ambulatory Visit: Payer: Self-pay | Admitting: Family Medicine

## 2024-01-28 LAB — EMPOWER MULTI-CANCER (2 + 38): REPORT SUMMARY: NEGATIVE

## 2024-02-13 ENCOUNTER — Ambulatory Visit
Admission: RE | Admit: 2024-02-13 | Discharge: 2024-02-13 | Disposition: A | Discharge status: Home / Self Care | Source: Ambulatory Visit | Attending: Family Medicine | Admitting: Family Medicine

## 2024-02-13 DIAGNOSIS — N83202 Unspecified ovarian cyst, left side: Secondary | ICD-10-CM | POA: Insufficient documentation

## 2024-04-08 ENCOUNTER — Ambulatory Visit: Admitting: Family Medicine

## 2024-04-08 VITALS — BP 107/66 | HR 70 | Wt 138.0 lb

## 2024-04-08 DIAGNOSIS — N83202 Unspecified ovarian cyst, left side: Secondary | ICD-10-CM

## 2024-04-08 DIAGNOSIS — N946 Dysmenorrhea, unspecified: Secondary | ICD-10-CM | POA: Diagnosis not present

## 2024-04-08 DIAGNOSIS — R102 Pelvic and perineal pain unspecified side: Secondary | ICD-10-CM

## 2024-04-08 NOTE — Progress Notes (Signed)
 RGYN here for F/U   U/S done on 02/13/24.  Pt has no concerns today

## 2024-04-08 NOTE — Progress Notes (Signed)
   GYNECOLOGY PROBLEM  VISIT ENCOUNTER NOTE  Subjective:   Cynthia Cherry is a 28 y.o. G0P0000 female here for a problem GYN visit.   Current complaints:     - pelvic pain- continues to have pain with cycle and also sometimes when not on cycle. She has questions about PCOS vs endometriosis. She is concerned she has endometriosis. Noted lifelong painful periods. She is not sexually active. Notes pain with US  probe and also any insertion.   Cycles remain every 25-29 days.  Denies abnormal vaginal bleeding, discharge, pelvic pain, problems with intercourse or other gynecologic concerns.    Gynecologic History Patient's last menstrual period was 03/25/2024.  Contraception: abstinence  Health Maintenance Due  Topic Date Due   DTaP/Tdap/Td (1 - Tdap) Never done   Pneumococcal Vaccine (1 of 2 - PCV) Never done   Hepatitis B Vaccines 19-59 Average Risk (1 of 3 - 19+ 3-dose series) Never done   COVID-19 Vaccine (3 - Pfizer risk series) 04/04/2020   HPV VACCINES (1 - 3-dose SCDM series) Never done   Influenza Vaccine  Never done    The following portions of the patient's history were reviewed and updated as appropriate: allergies, current medications, past family history, past medical history, past social history, past surgical history and problem list.  Review of Systems Pertinent items are noted in HPI.   Objective:  BP 107/66   Pulse 70   Wt 138 lb (62.6 kg)   LMP 03/25/2024   BMI 21.61 kg/m   Gen: well appearing, NAD HEENT: no scleral icterus CV: RR Lung: Normal WOB Ext: warm well perfused    Assessment and Plan:   1. Left ovarian cyst (Primary) Follow up in 6-12 weeks - US  PELVIC COMPLETE WITH TRANSVAGINAL; Future  2. Dysmenorrhea Likely endometriosis Reviewed less likely PCOS since cycles are regular Reviewed dx of endometriosis is done by ex lap or we could try orlissa therapy. Reviewed r/b of estrogen blocking. She will think about this Offered referral  to PT to help with pain. - US  PELVIC COMPLETE WITH TRANSVAGINAL; Future - Ambulatory referral to Physical Therapy  3. Pelvic pain - Ambulatory referral to Physical Therapy   Please refer to After Visit Summary for other counseling recommendations.   Return in about 3 months (around 07/09/2024) for follow up pelvic pain/heavy cycles.  Suzen Maryan Masters, MD, MPH, ABFM Attending Physician Faculty Practice- Center for [facility]

## 2024-04-11 ENCOUNTER — Ambulatory Visit: Attending: Family Medicine

## 2024-04-11 ENCOUNTER — Other Ambulatory Visit: Payer: Self-pay

## 2024-04-11 DIAGNOSIS — M62838 Other muscle spasm: Secondary | ICD-10-CM | POA: Insufficient documentation

## 2024-04-11 DIAGNOSIS — R102 Pelvic and perineal pain unspecified side: Secondary | ICD-10-CM | POA: Insufficient documentation

## 2024-04-11 DIAGNOSIS — R279 Unspecified lack of coordination: Secondary | ICD-10-CM | POA: Insufficient documentation

## 2024-04-11 DIAGNOSIS — M6281 Muscle weakness (generalized): Secondary | ICD-10-CM | POA: Diagnosis present

## 2024-04-11 DIAGNOSIS — N946 Dysmenorrhea, unspecified: Secondary | ICD-10-CM | POA: Diagnosis not present

## 2024-04-11 DIAGNOSIS — R293 Abnormal posture: Secondary | ICD-10-CM | POA: Insufficient documentation

## 2024-04-11 NOTE — Therapy (Signed)
 OUTPATIENT PHYSICAL THERAPY FEMALE PELVIC EVALUATION   Patient Name: Cynthia Cherry MRN: [id number] DOB:08/01/1995, 28 y.o., female Today's Date: 04/11/2024  END OF SESSION:  PT End of Session - 04/11/24 1237     Visit Number 1    Date for Recertification  09/26/24    Authorization Type [employer]    PT Start Time 1235    PT Stop Time 1312    PT Time Calculation (min) 37 min    Activity Tolerance Patient tolerated treatment well    Behavior During Therapy WFL for tasks assessed/performed          Past Medical History:  Diagnosis Date   Anxiety    Depression    Gastritis    Helicobacter pylori (H. pylori) infection 2020   Treated with anbx in 2020   Past Surgical History:  Procedure Laterality Date   OTHER SURGICAL HISTORY  2010?   Reports needing to be opened up to have period when in high school. Since this procedure she has had regular menses. Unsure if this was inperforate hymen or cervical stenosis   POLYPECTOMY     Patient Active Problem List   Diagnosis Date Noted   Iron deficiency anemia 05/30/2022   Leukopenia 05/30/2022   Menometrorrhagia 04/01/2021   Dysmenorrhea 04/01/2021    PCP: Marikay Eva POUR, PA  REFERRING PROVIDER: Eldonna Suzen Octave, MD  REFERRING DIAG: [phone] (ICD-10-CM) - Left ovarian cyst N94.6 (ICD-10-CM) - Dysmenorrhea  THERAPY DIAG:  Pelvic pain  Other muscle spasm  Unspecified lack of coordination  Muscle weakness (generalized)  Abnormal posture  Rationale for Evaluation and Treatment: Rehabilitation  ONSET DATE: since getting menstrual cycle  SUBJECTIVE:                                                                                                                                                                                           SUBJECTIVE STATEMENT: Pt here for chief complaint of pelvic pain. She states that she has always had vaginal pain at the doctor or when having ultrasound. She does not use  tampons because they always caused slight discomfort.   Menstrual cycles can be irregular. Have been very painful, but recently a little better. She will get severe cramps and clotting. Will have to take off work for the day. OBGYN did talk with patient about possible endometriosis.   She is in a relationship and is planning on becoming sexually active within the next year.    PAIN:  Are you having pain? Yes NPRS scale: 9/10 Pain location: Vaginal  Pain type: sharp Pain description: intermittent   Aggravating factors: something being in vaginal canal  Relieving factors: nothing in vaginal canal  PRECAUTIONS: None  RED FLAGS: None   WEIGHT BEARING RESTRICTIONS: No  FALLS:  Has patient fallen in last 6 months? No  OCCUPATION: journalist  ACTIVITY LEVEL : exercises daily  PLOF: Independent  PATIENT GOALS: be pain free; would like to make sure she does not have pain once she does to have intercourse  PERTINENT HISTORY:  Lt ovarian cyst, dysmenorrhea, anxiety, depression, gastritis Sexual abuse: No  BOWEL MOVEMENT: Pain with bowel movement: No Type of bowel movement:Frequency daily and Strain no Fully empty rectum: Yes:   Leakage: No Pads: No Fiber supplement/laxative No  URINATION: Pain with urination: No Fully empty bladder: Yes:   Stream: Strong Urgency: Yes - started recently - very hard to make it to the bathroom Frequency: every 2 hours  Fluid Intake: 84oz of water Leakage: Urge to void and Walking to the bathroom Pads: No  INTERCOURSE: Abstains   PREGNANCY: NA  PROLAPSE: None   OBJECTIVE:  Note: Objective measures were completed at Evaluation unless otherwise noted.  04/11/24 PATIENT SURVEYS:   PFIQ-7: 43  COGNITION: Overall cognitive status: Within functional limits for tasks assessed     SENSATION: Light touch: Appears intact   FUNCTIONAL TESTS:  Squat: WNL Single leg stance:  Rt: stable  Lt: stable Curl-up test:  distortion Sit-up test: 1/3   GAIT: Assistive device utilized: None Comments: WNL  POSTURE: rounded shoulders, forward head, increased lumbar lordosis, increased thoracic kyphosis, and anterior pelvic tilt   LUMBARAROM/PROM:  A/PROM A/PROM  Eval (% available)  Flexion 100  Extension 50  Right lateral flexion 75  Left lateral flexion 75  Right rotation 50  Left rotation 50   (Blank rows = not tested)  PALPATION:   General: WNL  Pelvic Alignment: significant anterior pelvic tilt, posterior placement of rib cage compared to anterior pelvis  Abdominal: tenderness throughout bil lower abdomen                External Perineal Exam: overall perineal bulge with bearing down                             Internal Pelvic Floor: introital tightness and discomfort bil, Lt>Rt; increased bil deep pelvic floor muscle restriction, Rt>Lt  Patient confirms identification and approves PT to assess internal pelvic floor and treatment Yes  PELVIC MMT:   MMT eval  Vaginal 2/5, 5 seconds, 3 repeat contractions, poor coordination of contract/relax  Diastasis Recti 2 finger width separation  (Blank rows = not tested)        TONE: high  PROLAPSE: Perineal bulge with bearing down  TODAY'S TREATMENT:                                                                                                                              DATE:  04/11/24 EVAL  Neuromuscular re-education: Initial down training program: Cat cow Child's  pose Butterfly Happy baby Diaphragmatic breathing  Therapeutic activities: Initial education on dilators and pelvic floor muscle wand to help improve muscular restriction and vaginal sensitivity Urge drill    PATIENT EDUCATION:  Education details: See above Person educated: Patient Education method: Explanation, Demonstration, Tactile cues, Verbal cues, and Handouts Education comprehension: verbalized understanding  HOME EXERCISE  PROGRAM: [id number]  ASSESSMENT:  CLINICAL IMPRESSION: Patient is a 28 y.o. female who was seen today for physical therapy evaluation and treatment for vaginal pain with gynecological exams and vaginal insertion. Exam findings notable for abnormal posture, mild limitations in lumbar A/ROM, decreased core strength with abdominal distortion and diastasis recti, lower abdominal tenderness, perineal laxity, pelvic floor muscle weakness and decreased endurance, difficulty with appropriate pelvic floor muscle proprioception of contract/relax, high tone pelvic floor, and tenderness throughout superficial and deep pelvic floor muscles. Signs and symptoms are most consistent with pelvic floor muscle spasm and restriction; due to all subjective and objective symptoms, underlying diagnosis of endometriosis should possibly be considered. Initial treatment of down training and urge drill tolerated well. She will continue to benefit from skilled PT intervention in order to decrease pain with vaginal penetration, prepare for pain free intercourse, decrease urinary urgency, address impairments, and improve quality of life.   OBJECTIVE IMPAIRMENTS: decreased activity tolerance, decreased coordination, decreased endurance, decreased mobility, decreased ROM, decreased strength, increased fascial restrictions, increased muscle spasms, impaired flexibility, impaired tone, improper body mechanics, postural dysfunction, and pain.   ACTIVITY LIMITATIONS: continence and vaginal penetration  PARTICIPATION LIMITATIONS: gynecological care, interpersonal relationship  PERSONAL FACTORS: 1 comorbidity: medical history are also affecting patient's functional outcome.   REHAB POTENTIAL: Good  CLINICAL DECISION MAKING: Stable/uncomplicated  EVALUATION COMPLEXITY: Low   GOALS: Goals reviewed with patient? Yes  SHORT TERM GOALS: Target date: 05/09/2024   Pt will be independent with HEP in order to improve activity tolerance.    Baseline: Goal status: INITIAL  2.  Pt will begin dilator program and use 3x/week in order to decrease vaginal tightness and sensitivity.   Baseline: no dilator program Goal status: INITIAL  3.  Pt will increase all impaired lumbar A/ROM by 25% without pain in order to improve length tension relationship in pelvic floor muscles, decreasing urinary urgency and incontinence.    Baseline:  Goal status: INITIAL  4.  Pt will be able to perform curl-up test without abdominal distortion in order to decrease bearing down on pelvic floor to improve urinary urgency.  Baseline:  Goal status: INITIAL  5.  Pt will be able to teach back and utilize urge suppression technique in order to help reduce number of trips to the bathroom.    Baseline:  Goal status: INITIAL    LONG TERM GOALS: Target date: 09/27/23  Pt will be independent with advanced HEP in order to improve activity tolerance.   Baseline:  Goal status: INITIAL  2.  Pt will report 0/10 pain with vaginal penetration in order to have comfortable gynecological exam and necessary preventative care.  Baseline: 9/10 Goal status: INITIAL  3.  Pt will be independent with dilator program in order to achieve pain free vaginal penetration. Baseline: 9/10 pain Goal status: INITIAL  4.  Pt will be able to go 2-3 hours in between voids without urgency or incontinence in order to improve QOL and perform all functional activities with less difficulty.   Baseline: sudden urgency and leaking on the way to the bathroom Goal status: INITIAL  5.  Pt will be able to perform 3/3 sit-up  test without abdominal distortion in order to decrease bearing down on pelvic floor to decrease urinary urgency.  Baseline: 1/3 Goal status: INITIAL  PLAN:  PT FREQUENCY: 1-2x/week  PT DURATION: 16 visits   PLANNED INTERVENTIONS: 97164- PT Re-evaluation, 97110-Therapeutic exercises, 97530- Therapeutic activity, 97112- Neuromuscular re-education, 97535-  Self Care, 02859- Manual therapy, [phone]- Gait training, [phone]- Aquatic Therapy, [phone]- Electrical stimulation (unattended), [phone]- Traction (mechanical), [id number]- Ionotophoresis 4mg /ml Dexamethasone, 79439 (1-2 muscles), 20561 (3+ muscles)- Dry Needling, Patient/Family education, Balance training, Taping, Joint mobilization, Joint manipulation, Spinal manipulation, Spinal mobilization, Scar mobilization, Vestibular training, Cryotherapy, Moist heat, and Biofeedback  PLAN FOR NEXT SESSION: progress down training; diaphragmatic breathing training; core strengthening; dilator education  Josette Mares, PT, DPT10/09/251:30 PM

## 2024-04-11 NOTE — Patient Instructions (Signed)
   Urge Drill  When you feel an urge to go, follow these steps to regain control: Stop what you are doing and be still Take one deep breath, directing your air into your abdomen Think an affirming thought, such as "I've got this." Do 5 quick flicks of your pelvic floor Walk with control to the bathroom to void, or delay voiding   [facility] [address] [place], [place] [address] Phone # [phone] Fax [phone]

## 2024-04-16 ENCOUNTER — Ambulatory Visit: Admitting: Physical Therapy

## 2024-04-16 ENCOUNTER — Encounter: Payer: Self-pay | Admitting: Physical Therapy

## 2024-04-16 DIAGNOSIS — R102 Pelvic and perineal pain unspecified side: Secondary | ICD-10-CM

## 2024-04-16 DIAGNOSIS — M62838 Other muscle spasm: Secondary | ICD-10-CM

## 2024-04-16 DIAGNOSIS — R279 Unspecified lack of coordination: Secondary | ICD-10-CM

## 2024-04-16 DIAGNOSIS — M6281 Muscle weakness (generalized): Secondary | ICD-10-CM

## 2024-04-16 DIAGNOSIS — R293 Abnormal posture: Secondary | ICD-10-CM

## 2024-04-16 NOTE — Therapy (Signed)
 OUTPATIENT PHYSICAL THERAPY FEMALE PELVIC EVALUATION   Patient Name: Cynthia Cherry MRN: [id number] DOB:1996/05/03, 28 y.o., female Today's Date: 04/16/2024  END OF SESSION:  PT End of Session - 04/16/24 1623     Visit Number 2    Date for Recertification  09/26/24    Authorization Type [employer]    PT Start Time 1622    PT Stop Time 1700    PT Time Calculation (min) 38 min    Activity Tolerance Patient tolerated treatment well    Behavior During Therapy WFL for tasks assessed/performed           Past Medical History:  Diagnosis Date   Anxiety    Depression    Gastritis    Helicobacter pylori (H. pylori) infection 2020   Treated with anbx in 2020   Past Surgical History:  Procedure Laterality Date   OTHER SURGICAL HISTORY  2010?   Reports needing to be opened up to have period when in high school. Since this procedure she has had regular menses. Unsure if this was inperforate hymen or cervical stenosis   POLYPECTOMY     Patient Active Problem List   Diagnosis Date Noted   Iron deficiency anemia 05/30/2022   Leukopenia 05/30/2022   Menometrorrhagia 04/01/2021   Dysmenorrhea 04/01/2021    PCP: Marikay Eva POUR, PA  REFERRING PROVIDER: Eldonna Suzen Octave, MD  REFERRING DIAG: [phone] (ICD-10-CM) - Left ovarian cyst N94.6 (ICD-10-CM) - Dysmenorrhea  THERAPY DIAG:  Pelvic pain  Other muscle spasm  Unspecified lack of coordination  Muscle weakness (generalized)  Abnormal posture  Rationale for Evaluation and Treatment: Rehabilitation  ONSET DATE: since getting menstrual cycle  SUBJECTIVE:                                                                                                                                                                                           SUBJECTIVE STATEMENT: Patient reports that she has always had discomfort She thinks she could have endometriosis. Pan with period 8/10 Has food alleriges   eval Pt  here for chief complaint of pelvic pain. She states that she has always had vaginal pain at the doctor or when having ultrasound. She does not use tampons because they always caused slight discomfort.   Menstrual cycles can be irregular. Have been very painful, but recently a little better. She will get severe cramps and clotting. Will have to take off work for the day. OBGYN did talk with patient about possible endometriosis.   She is in a relationship and is planning on becoming sexually active within the next year.    PAIN:  Are you  having pain? Yes NPRS scale: 9/10 Pain location: Vaginal  Pain type: sharp Pain description: intermittent   Aggravating factors: something being in vaginal canal Relieving factors: nothing in vaginal canal  PRECAUTIONS: None  RED FLAGS: None   WEIGHT BEARING RESTRICTIONS: No  FALLS:  Has patient fallen in last 6 months? No  OCCUPATION: journalist  ACTIVITY LEVEL : exercises daily  PLOF: Independent  PATIENT GOALS: be pain free; would like to make sure she does not have pain once she does to have intercourse  PERTINENT HISTORY:  Lt ovarian cyst, dysmenorrhea, anxiety, depression, gastritis Sexual abuse: No  BOWEL MOVEMENT: Pain with bowel movement: No Type of bowel movement:Frequency daily and Strain no Fully empty rectum: Yes:   Leakage: No Pads: No Fiber supplement/laxative No  URINATION: Pain with urination: No Fully empty bladder: Yes:   Stream: Strong Urgency: Yes - started recently - very hard to make it to the bathroom Frequency: every 2 hours  Fluid Intake: 84oz of water Leakage: Urge to void and Walking to the bathroom Pads: No  INTERCOURSE: Abstains   PREGNANCY: NA  PROLAPSE: None   OBJECTIVE:  Note: Objective measures were completed at Evaluation unless otherwise noted.  04/11/24 PATIENT SURVEYS:   PFIQ-7: 43  COGNITION: Overall cognitive status: Within functional limits for tasks  assessed     SENSATION: Light touch: Appears intact   FUNCTIONAL TESTS:  Squat: WNL Single leg stance:  Rt: stable  Lt: stable Curl-up test: distortion Sit-up test: 1/3   GAIT: Assistive device utilized: None Comments: WNL  POSTURE: rounded shoulders, forward head, increased lumbar lordosis, increased thoracic kyphosis, and anterior pelvic tilt   LUMBARAROM/PROM:  A/PROM A/PROM  Eval (% available)  Flexion 100  Extension 50  Right lateral flexion 75  Left lateral flexion 75  Right rotation 50  Left rotation 50   (Blank rows = not tested)  PALPATION:   General: WNL  Pelvic Alignment: significant anterior pelvic tilt, posterior placement of rib cage compared to anterior pelvis  Abdominal: tenderness throughout bil lower abdomen                External Perineal Exam: overall perineal bulge with bearing down                             Internal Pelvic Floor: introital tightness and discomfort bil, Lt>Rt; increased bil deep pelvic floor muscle restriction, Rt>Lt  Patient confirms identification and approves PT to assess internal pelvic floor and treatment Yes  PELVIC MMT:   MMT eval  Vaginal 2/5, 5 seconds, 3 repeat contractions, poor coordination of contract/relax  Diastasis Recti 2 finger width separation  (Blank rows = not tested)        TONE: high  PROLAPSE: Perineal bulge with bearing down  TODAY'S TREATMENT:  DATE:  Neurom reed- child's pose, Diaphragmatic breathing  There acts- Education on endo, surgeries, provider (Dr Luci at [facility]) Education on dilators and pelvic wand, different types, lubricant- protocol provided        04/11/24 EVAL  Neuromuscular re-education: Initial down training program: Cat cow Child's pose Butterfly Happy baby Diaphragmatic breathing  Therapeutic activities: Initial education on  dilators and pelvic floor muscle wand to help improve muscular restriction and vaginal sensitivity Urge drill    PATIENT EDUCATION:  Education details: See above Person educated: Patient Education method: Explanation, Demonstration, Tactile cues, Verbal cues, and Handouts Education comprehension: verbalized understanding  HOME EXERCISE PROGRAM: [id number]  ASSESSMENT:  CLINICAL IMPRESSION: Patient was seen today for treatment of pelvic pain. Patient with a lot of questions about potential endometriosis and we talked at length about symptoms and physical therapy endometriosis recommendations. Demonstrated use dof pelvic wand and dilators as well. Handout provided. Patient did well with exercises and education today. Patient is progressing well towards goals and will benefit from continued PT to address deficits, reduce pelvic and abdominal pain and pain with period and intercourse and improve quality of life.      From eval Patient is a 28 y.o. female who was seen today for physical therapy evaluation and treatment for vaginal pain with gynecological exams and vaginal insertion. Exam findings notable for abnormal posture, mild limitations in lumbar A/ROM, decreased core strength with abdominal distortion and diastasis recti, lower abdominal tenderness, perineal laxity, pelvic floor muscle weakness and decreased endurance, difficulty with appropriate pelvic floor muscle proprioception of contract/relax, high tone pelvic floor, and tenderness throughout superficial and deep pelvic floor muscles. Signs and symptoms are most consistent with pelvic floor muscle spasm and restriction; due to all subjective and objective symptoms, underlying diagnosis of endometriosis should possibly be considered. Initial treatment of down training and urge drill tolerated well. She will continue to benefit from skilled PT intervention in order to decrease pain with vaginal penetration, prepare for pain free intercourse,  decrease urinary urgency, address impairments, and improve quality of life.   OBJECTIVE IMPAIRMENTS: decreased activity tolerance, decreased coordination, decreased endurance, decreased mobility, decreased ROM, decreased strength, increased fascial restrictions, increased muscle spasms, impaired flexibility, impaired tone, improper body mechanics, postural dysfunction, and pain.   ACTIVITY LIMITATIONS: continence and vaginal penetration  PARTICIPATION LIMITATIONS: gynecological care, interpersonal relationship  PERSONAL FACTORS: 1 comorbidity: medical history are also affecting patient's functional outcome.   REHAB POTENTIAL: Good  CLINICAL DECISION MAKING: Stable/uncomplicated  EVALUATION COMPLEXITY: Low   GOALS: Goals reviewed with patient? Yes  SHORT TERM GOALS: Target date: 05/09/2024   Pt will be independent with HEP in order to improve activity tolerance.   Baseline: Goal status: INITIAL  2.  Pt will begin dilator program and use 3x/week in order to decrease vaginal tightness and sensitivity.   Baseline: no dilator program Goal status: INITIAL  3.  Pt will increase all impaired lumbar A/ROM by 25% without pain in order to improve length tension relationship in pelvic floor muscles, decreasing urinary urgency and incontinence.    Baseline:  Goal status: INITIAL  4.  Pt will be able to perform curl-up test without abdominal distortion in order to decrease bearing down on pelvic floor to improve urinary urgency.  Baseline:  Goal status: INITIAL  5.  Pt will be able to teach back and utilize urge suppression technique in order to help reduce number of trips to the bathroom.    Baseline:  Goal status: INITIAL  LONG TERM GOALS: Target date: 09/27/23  Pt will be independent with advanced HEP in order to improve activity tolerance.   Baseline:  Goal status: INITIAL  2.  Pt will report 0/10 pain with vaginal penetration in order to have comfortable gynecological  exam and necessary preventative care.  Baseline: 9/10 Goal status: INITIAL  3.  Pt will be independent with dilator program in order to achieve pain free vaginal penetration. Baseline: 9/10 pain Goal status: INITIAL  4.  Pt will be able to go 2-3 hours in between voids without urgency or incontinence in order to improve QOL and perform all functional activities with less difficulty.   Baseline: sudden urgency and leaking on the way to the bathroom Goal status: INITIAL  5.  Pt will be able to perform 3/3 sit-up test without abdominal distortion in order to decrease bearing down on pelvic floor to decrease urinary urgency.  Baseline: 1/3 Goal status: INITIAL  PLAN:  PT FREQUENCY: 1-2x/week  PT DURATION: 16 visits   PLANNED INTERVENTIONS: 97164- PT Re-evaluation, 97110-Therapeutic exercises, 97530- Therapeutic activity, 97112- Neuromuscular re-education, 97535- Self Care, 02859- Manual therapy, [phone]- Gait training, [phone]- Aquatic Therapy, [phone]- Electrical stimulation (unattended), [phone]- Traction (mechanical), [id number]- Ionotophoresis 4mg /ml Dexamethasone, 20560 (1-2 muscles), 20561 (3+ muscles)- Dry Needling, Patient/Family education, Balance training, Taping, Joint mobilization, Joint manipulation, Spinal manipulation, Spinal mobilization, Scar mobilization, Vestibular training, Cryotherapy, Moist heat, and Biofeedback  PLAN FOR NEXT SESSION: progress down training; diaphragmatic breathing training; core strengthening; dilator education  Amyiah Gaba, PT, DPT 04/16/24 5:10 PM

## 2024-04-22 ENCOUNTER — Ambulatory Visit

## 2024-04-22 DIAGNOSIS — R293 Abnormal posture: Secondary | ICD-10-CM

## 2024-04-22 DIAGNOSIS — M62838 Other muscle spasm: Secondary | ICD-10-CM

## 2024-04-22 DIAGNOSIS — M6281 Muscle weakness (generalized): Secondary | ICD-10-CM

## 2024-04-22 DIAGNOSIS — R102 Pelvic and perineal pain unspecified side: Secondary | ICD-10-CM

## 2024-04-22 DIAGNOSIS — R279 Unspecified lack of coordination: Secondary | ICD-10-CM

## 2024-04-22 NOTE — Therapy (Signed)
 OUTPATIENT PHYSICAL THERAPY FEMALE PELVIC TREATMENT   Patient Name: Cynthia Cherry MRN: [id number] DOB:1996/03/18, 28 y.o., female Today's Date: 04/22/2024  END OF SESSION:  PT End of Session - 04/22/24 1236     Visit Number 3    Date for Recertification  09/26/24    Authorization Type [employer]    PT Start Time 1235    PT Stop Time 1313    PT Time Calculation (min) 38 min    Activity Tolerance Patient tolerated treatment well    Behavior During Therapy WFL for tasks assessed/performed           Past Medical History:  Diagnosis Date   Anxiety    Depression    Gastritis    Helicobacter pylori (H. pylori) infection 2020   Treated with anbx in 2020   Past Surgical History:  Procedure Laterality Date   OTHER SURGICAL HISTORY  2010?   Reports needing to be opened up to have period when in high school. Since this procedure she has had regular menses. Unsure if this was inperforate hymen or cervical stenosis   POLYPECTOMY     Patient Active Problem List   Diagnosis Date Noted   Iron deficiency anemia 05/30/2022   Leukopenia 05/30/2022   Menometrorrhagia 04/01/2021   Dysmenorrhea 04/01/2021    PCP: Marikay Eva POUR, PA  REFERRING PROVIDER: Eldonna Suzen Octave, MD  REFERRING DIAG: [phone] (ICD-10-CM) - Left ovarian cyst N94.6 (ICD-10-CM) - Dysmenorrhea  THERAPY DIAG:  Pelvic pain  Other muscle spasm  Unspecified lack of coordination  Muscle weakness (generalized)  Abnormal posture  Rationale for Evaluation and Treatment: Rehabilitation  ONSET DATE: since getting menstrual cycle  SUBJECTIVE:                                                                                                                                                                                           SUBJECTIVE STATEMENT:    PAIN: 04/22/24 Are you having pain? Yes NPRS scale: 9/10 Pain location: Vaginal  Pain type: sharp Pain description: intermittent    Aggravating factors: something being in vaginal canal Relieving factors: nothing in vaginal canal  PRECAUTIONS: None  RED FLAGS: None   WEIGHT BEARING RESTRICTIONS: No  FALLS:  Has patient fallen in last 6 months? No  OCCUPATION: journalist  ACTIVITY LEVEL : exercises daily  PLOF: Independent  PATIENT GOALS: be pain free; would like to make sure she does not have pain once she does to have intercourse  PERTINENT HISTORY:  Lt ovarian cyst, dysmenorrhea, anxiety, depression, gastritis Sexual abuse: No  BOWEL MOVEMENT: Pain with bowel movement: No Type of bowel movement:Frequency daily and  Strain no Fully empty rectum: Yes:   Leakage: No Pads: No Fiber supplement/laxative No  URINATION: Pain with urination: No Fully empty bladder: Yes:   Stream: Strong Urgency: Yes - started recently - very hard to make it to the bathroom Frequency: every 2 hours  Fluid Intake: 84oz of water Leakage: Urge to void and Walking to the bathroom Pads: No  INTERCOURSE: Abstains   PREGNANCY: NA  PROLAPSE: None   OBJECTIVE:  Note: Objective measures were completed at Evaluation unless otherwise noted.  04/11/24 PATIENT SURVEYS:   PFIQ-7: 43  COGNITION: Overall cognitive status: Within functional limits for tasks assessed     SENSATION: Light touch: Appears intact   FUNCTIONAL TESTS:  Squat: WNL Single leg stance:  Rt: stable  Lt: stable Curl-up test: distortion Sit-up test: 1/3   GAIT: Assistive device utilized: None Comments: WNL  POSTURE: rounded shoulders, forward head, increased lumbar lordosis, increased thoracic kyphosis, and anterior pelvic tilt   LUMBARAROM/PROM:  A/PROM A/PROM  Eval (% available)  Flexion 100  Extension 50  Right lateral flexion 75  Left lateral flexion 75  Right rotation 50  Left rotation 50   (Blank rows = not tested)  PALPATION:   General: WNL  Pelvic Alignment: significant anterior pelvic tilt, posterior placement  of rib cage compared to anterior pelvis  Abdominal: tenderness throughout bil lower abdomen                External Perineal Exam: overall perineal bulge with bearing down                             Internal Pelvic Floor: introital tightness and discomfort bil, Lt>Rt; increased bil deep pelvic floor muscle restriction, Rt>Lt  Patient confirms identification and approves PT to assess internal pelvic floor and treatment Yes  PELVIC MMT:   MMT eval  Vaginal 2/5, 5 seconds, 3 repeat contractions, poor coordination of contract/relax  Diastasis Recti 2 finger width separation  (Blank rows = not tested)        TONE: high  PROLAPSE: Perineal bulge with bearing down  TODAY'S TREATMENT:                                                                                                                              DATE:  04/22/24 Manual: Pt provides verbal consent for internal vaginal/rectal pelvic floor exam. Internal vaginal superficial and deep pelvic floor muscle release to tolerance Neuromuscular re-education: Diaphragmatic breathing for improved pelvic floor muscle lengthening and desensitization Therapeutic activities: Continued dilator education - recommended silicone dilators  Lubricant discussion - only water based with silicone dilators - samples given  Pelvic floor muscle education and how it may be beneficial for deeper pelvic floor muscle release    Neurom reed- child's pose, Diaphragmatic breathing  There acts- Education on endo, surgeries, provider (Dr Luci at [facility]) Education on dilators and pelvic wand, different types, lubricant-  protocol provided        04/11/24 EVAL  Neuromuscular re-education: Initial down training program: Cat cow Child's pose Butterfly Happy baby Diaphragmatic breathing  Therapeutic activities: Initial education on dilators and pelvic floor muscle wand to help improve muscular restriction and vaginal sensitivity Urge  drill    PATIENT EDUCATION:  Education details: See above Person educated: Patient Education method: Explanation, Demonstration, Tactile cues, Verbal cues, and Handouts Education comprehension: verbalized understanding  HOME EXERCISE PROGRAM: [id number]  ASSESSMENT:  CLINICAL IMPRESSION:  Patient is a 28 y.o. female who was seen today for physical therapy treatment for vaginal pain with gynecological exams and vaginal insertion. Continued patient education performed on dilators, frequency of use, and lubrication. We decided that dilators would be more beneficial than pelvic floor muscle wand at this time due to more sensitivity and restriction present at introitus vs deep pelvic floor muscle muscles. She has already seen improvement in in deep restriction working on diaphragmatic breathing and relaxation exercises. She tolerated superficial manual desensitization techniques in conjunction with diaphragmatic breathing well today. She will continue to benefit from skilled PT intervention in order to decrease pain with vaginal penetration, prepare for pain free intercourse, decrease urinary urgency, address impairments, and improve quality of life.   OBJECTIVE IMPAIRMENTS: decreased activity tolerance, decreased coordination, decreased endurance, decreased mobility, decreased ROM, decreased strength, increased fascial restrictions, increased muscle spasms, impaired flexibility, impaired tone, improper body mechanics, postural dysfunction, and pain.   ACTIVITY LIMITATIONS: continence and vaginal penetration  PARTICIPATION LIMITATIONS: gynecological care, interpersonal relationship  PERSONAL FACTORS: 1 comorbidity: medical history are also affecting patient's functional outcome.   REHAB POTENTIAL: Good  CLINICAL DECISION MAKING: Stable/uncomplicated  EVALUATION COMPLEXITY: Low   GOALS: Goals reviewed with patient? Yes  SHORT TERM GOALS: Target date: 05/09/2024   Pt will be independent  with HEP in order to improve activity tolerance.   Baseline: Goal status: INITIAL  2.  Pt will begin dilator program and use 3x/week in order to decrease vaginal tightness and sensitivity.   Baseline: no dilator program Goal status: INITIAL  3.  Pt will increase all impaired lumbar A/ROM by 25% without pain in order to improve length tension relationship in pelvic floor muscles, decreasing urinary urgency and incontinence.    Baseline:  Goal status: INITIAL  4.  Pt will be able to perform curl-up test without abdominal distortion in order to decrease bearing down on pelvic floor to improve urinary urgency.  Baseline:  Goal status: INITIAL  5.  Pt will be able to teach back and utilize urge suppression technique in order to help reduce number of trips to the bathroom.    Baseline:  Goal status: INITIAL    LONG TERM GOALS: Target date: 09/27/23  Pt will be independent with advanced HEP in order to improve activity tolerance.   Baseline:  Goal status: INITIAL  2.  Pt will report 0/10 pain with vaginal penetration in order to have comfortable gynecological exam and necessary preventative care.  Baseline: 9/10 Goal status: INITIAL  3.  Pt will be independent with dilator program in order to achieve pain free vaginal penetration. Baseline: 9/10 pain Goal status: INITIAL  4.  Pt will be able to go 2-3 hours in between voids without urgency or incontinence in order to improve QOL and perform all functional activities with less difficulty.   Baseline: sudden urgency and leaking on the way to the bathroom Goal status: INITIAL  5.  Pt will be able to perform 3/3 sit-up test  without abdominal distortion in order to decrease bearing down on pelvic floor to decrease urinary urgency.  Baseline: 1/3 Goal status: INITIAL  PLAN:  PT FREQUENCY: 1-2x/week  PT DURATION: 16 visits   PLANNED INTERVENTIONS: 97164- PT Re-evaluation, 97110-Therapeutic exercises, 97530- Therapeutic  activity, 97112- Neuromuscular re-education, 97535- Self Care, 02859- Manual therapy, [phone]- Gait training, [phone]- Aquatic Therapy, [phone]- Electrical stimulation (unattended), [phone]- Traction (mechanical), [id number]- Ionotophoresis 4mg /ml Dexamethasone, 20560 (1-2 muscles), 20561 (3+ muscles)- Dry Needling, Patient/Family education, Balance training, Taping, Joint mobilization, Joint manipulation, Spinal manipulation, Spinal mobilization, Scar mobilization, Vestibular training, Cryotherapy, Moist heat, and Biofeedback  PLAN FOR NEXT SESSION: progress down training; diaphragmatic breathing training; core strengthening; dilator education  Josette Mares, PT, DPT10/20/251:17 PM [facility] [address], [address] [place], [place] [address] Phone # [phone] Fax [phone]

## 2024-05-01 ENCOUNTER — Ambulatory Visit: Payer: Self-pay

## 2024-05-01 DIAGNOSIS — R279 Unspecified lack of coordination: Secondary | ICD-10-CM

## 2024-05-01 DIAGNOSIS — R102 Pelvic and perineal pain unspecified side: Secondary | ICD-10-CM

## 2024-05-01 DIAGNOSIS — M6281 Muscle weakness (generalized): Secondary | ICD-10-CM

## 2024-05-01 DIAGNOSIS — M62838 Other muscle spasm: Secondary | ICD-10-CM

## 2024-05-01 DIAGNOSIS — R293 Abnormal posture: Secondary | ICD-10-CM

## 2024-05-01 NOTE — Therapy (Signed)
 OUTPATIENT PHYSICAL THERAPY FEMALE PELVIC TREATMENT   Patient Name: Cynthia Cherry MRN: [id number] DOB:10-03-1995, 28 y.o., female Today's Date: 05/01/2024  END OF SESSION:  PT End of Session - 05/01/24 1241     Visit Number 4    Date for Recertification  09/26/24    Authorization Type [employer]    PT Start Time 1239    PT Stop Time 1317    PT Time Calculation (min) 38 min    Activity Tolerance Patient tolerated treatment well    Behavior During Therapy WFL for tasks assessed/performed           Past Medical History:  Diagnosis Date   Anxiety    Depression    Gastritis    Helicobacter pylori (H. pylori) infection 2020   Treated with anbx in 2020   Past Surgical History:  Procedure Laterality Date   OTHER SURGICAL HISTORY  2010?   Reports needing to be opened up to have period when in high school. Since this procedure she has had regular menses. Unsure if this was inperforate hymen or cervical stenosis   POLYPECTOMY     Patient Active Problem List   Diagnosis Date Noted   Iron deficiency anemia 05/30/2022   Leukopenia 05/30/2022   Menometrorrhagia 04/01/2021   Dysmenorrhea 04/01/2021    PCP: Marikay Eva POUR, PA  REFERRING PROVIDER: Eldonna Suzen Octave, MD  REFERRING DIAG: [phone] (ICD-10-CM) - Left ovarian cyst N94.6 (ICD-10-CM) - Dysmenorrhea  THERAPY DIAG:  Pelvic pain  Other muscle spasm  Unspecified lack of coordination  Muscle weakness (generalized)  Abnormal posture  Rationale for Evaluation and Treatment: Rehabilitation  ONSET DATE: since getting menstrual cycle  SUBJECTIVE:                                                                                                                                                                                           SUBJECTIVE STATEMENT:  Pt states that she had cycle start the day after our last session and it was much more painful than it had been for several cycles. She also had the  sensation of needing to vomit which had not happened in a while.    PAIN: 05/01/24 Are you having pain? Yes NPRS scale: 9/10 Pain location: Vaginal  Pain type: sharp Pain description: intermittent   Aggravating factors: something being in vaginal canal Relieving factors: nothing in vaginal canal  PRECAUTIONS: None  RED FLAGS: None   WEIGHT BEARING RESTRICTIONS: No  FALLS:  Has patient fallen in last 6 months? No  OCCUPATION: journalist  ACTIVITY LEVEL : exercises daily  PLOF: Independent  PATIENT GOALS: be pain free; would  like to make sure she does not have pain once she does to have intercourse  PERTINENT HISTORY:  Lt ovarian cyst, dysmenorrhea, anxiety, depression, gastritis Sexual abuse: No  BOWEL MOVEMENT: Pain with bowel movement: No Type of bowel movement:Frequency daily and Strain no Fully empty rectum: Yes:   Leakage: No Pads: No Fiber supplement/laxative No  URINATION: Pain with urination: No Fully empty bladder: Yes:   Stream: Strong Urgency: Yes - started recently - very hard to make it to the bathroom Frequency: every 2 hours  Fluid Intake: 84oz of water Leakage: Urge to void and Walking to the bathroom Pads: No  INTERCOURSE: Abstains   PREGNANCY: NA  PROLAPSE: None   OBJECTIVE:  Note: Objective measures were completed at Evaluation unless otherwise noted.  04/11/24 PATIENT SURVEYS:   PFIQ-7: 43  COGNITION: Overall cognitive status: Within functional limits for tasks assessed     SENSATION: Light touch: Appears intact   FUNCTIONAL TESTS:  Squat: WNL Single leg stance:  Rt: stable  Lt: stable Curl-up test: distortion Sit-up test: 1/3   GAIT: Assistive device utilized: None Comments: WNL  POSTURE: rounded shoulders, forward head, increased lumbar lordosis, increased thoracic kyphosis, and anterior pelvic tilt   LUMBARAROM/PROM:  A/PROM A/PROM  Eval (% available)  Flexion 100  Extension 50  Right lateral  flexion 75  Left lateral flexion 75  Right rotation 50  Left rotation 50   (Blank rows = not tested)  PALPATION:   General: WNL  Pelvic Alignment: significant anterior pelvic tilt, posterior placement of rib cage compared to anterior pelvis  Abdominal: tenderness throughout bil lower abdomen                External Perineal Exam: overall perineal bulge with bearing down                             Internal Pelvic Floor: introital tightness and discomfort bil, Lt>Rt; increased bil deep pelvic floor muscle restriction, Rt>Lt  Patient confirms identification and approves PT to assess internal pelvic floor and treatment Yes  PELVIC MMT:   MMT eval  Vaginal 2/5, 5 seconds, 3 repeat contractions, poor coordination of contract/relax  Diastasis Recti 2 finger width separation  (Blank rows = not tested)        TONE: high  PROLAPSE: Perineal bulge with bearing down  TODAY'S TREATMENT:                                                                                                                              DATE:  05/01/24 Manual: Pt provides verbal consent for internal vaginal/rectal pelvic floor exam. Internal vaginal superficial and deep pelvic floor muscle release to tolerance Supine abdominal soft tissue mobilization  Therapeutic activities: Pt education on tracking cycles, noting pain, when she had pelvic floor muscle release, clotting, and heaviness Pt education on how pelvic floor muscle release in session cannot impact clotting  and bleeding during cycles, but could certainly impact pain that she is having - since cycle started day after first pelvic floor muscle release, this could have been residual soreness  04/22/24 Manual: Pt provides verbal consent for internal vaginal/rectal pelvic floor exam. Internal vaginal superficial and deep pelvic floor muscle release to tolerance Neuromuscular re-education: Diaphragmatic breathing for improved pelvic floor muscle  lengthening and desensitization Therapeutic activities: Continued dilator education - recommended silicone dilators  Lubricant discussion - only water based with silicone dilators - samples given  Pelvic floor muscle education and how it may be beneficial for deeper pelvic floor muscle release    Neurom reed- child's pose, Diaphragmatic breathing  There acts- Education on endo, surgeries, provider (Dr Luci at [facility]) Education on dilators and pelvic wand, different types, lubricant- protocol provided     PATIENT EDUCATION:  Education details: See above Person educated: Patient Education method: Explanation, Demonstration, Tactile cues, Verbal cues, and Handouts Education comprehension: verbalized understanding  HOME EXERCISE PROGRAM: [id number]  ASSESSMENT:  CLINICAL IMPRESSION:  Patient is a 28 y.o. female who was seen today for physical therapy treatment for vaginal pain with gynecological exams and vaginal insertion. Pt had very painful cycle that started the day after her last treatment; wondering if some of this pain was due to residual soreness from pelvic floor muscle release. She was encouraged to track symptoms this week and see if she has similar pain not being on cycle, but performing pelvic floor muscle release. We discussed tracking cycles in general with more information. She demonstrated large improvement in pain and restriction in bil levator ani, but continues to have more restriction in superficial layers. She will continue to benefit from skilled PT intervention in order to decrease pain with vaginal penetration, prepare for pain free intercourse, decrease urinary urgency, address impairments, and improve quality of life.   OBJECTIVE IMPAIRMENTS: decreased activity tolerance, decreased coordination, decreased endurance, decreased mobility, decreased ROM, decreased strength, increased fascial restrictions, increased muscle spasms, impaired flexibility, impaired tone,  improper body mechanics, postural dysfunction, and pain.   ACTIVITY LIMITATIONS: continence and vaginal penetration  PARTICIPATION LIMITATIONS: gynecological care, interpersonal relationship  PERSONAL FACTORS: 1 comorbidity: medical history are also affecting patient's functional outcome.   REHAB POTENTIAL: Good  CLINICAL DECISION MAKING: Stable/uncomplicated  EVALUATION COMPLEXITY: Low   GOALS: Goals reviewed with patient? Yes  SHORT TERM GOALS: Target date: 05/09/2024   Pt will be independent with HEP in order to improve activity tolerance.   Baseline: Goal status: INITIAL  2.  Pt will begin dilator program and use 3x/week in order to decrease vaginal tightness and sensitivity.   Baseline: no dilator program Goal status: INITIAL  3.  Pt will increase all impaired lumbar A/ROM by 25% without pain in order to improve length tension relationship in pelvic floor muscles, decreasing urinary urgency and incontinence.    Baseline:  Goal status: INITIAL  4.  Pt will be able to perform curl-up test without abdominal distortion in order to decrease bearing down on pelvic floor to improve urinary urgency.  Baseline:  Goal status: INITIAL  5.  Pt will be able to teach back and utilize urge suppression technique in order to help reduce number of trips to the bathroom.    Baseline:  Goal status: INITIAL    LONG TERM GOALS: Target date: 09/27/23  Pt will be independent with advanced HEP in order to improve activity tolerance.   Baseline:  Goal status: INITIAL  2.  Pt will report 0/10 pain with  vaginal penetration in order to have comfortable gynecological exam and necessary preventative care.  Baseline: 9/10 Goal status: INITIAL  3.  Pt will be independent with dilator program in order to achieve pain free vaginal penetration. Baseline: 9/10 pain Goal status: INITIAL  4.  Pt will be able to go 2-3 hours in between voids without urgency or incontinence in order to  improve QOL and perform all functional activities with less difficulty.   Baseline: sudden urgency and leaking on the way to the bathroom Goal status: INITIAL  5.  Pt will be able to perform 3/3 sit-up test without abdominal distortion in order to decrease bearing down on pelvic floor to decrease urinary urgency.  Baseline: 1/3 Goal status: INITIAL  PLAN:  PT FREQUENCY: 1-2x/week  PT DURATION: 16 visits   PLANNED INTERVENTIONS: 97164- PT Re-evaluation, 97110-Therapeutic exercises, 97530- Therapeutic activity, 97112- Neuromuscular re-education, 97535- Self Care, 02859- Manual therapy, [phone]- Gait training, [phone]- Aquatic Therapy, [phone]- Electrical stimulation (unattended), [phone]- Traction (mechanical), [id number]- Ionotophoresis 4mg /ml Dexamethasone, 20560 (1-2 muscles), 20561 (3+ muscles)- Dry Needling, Patient/Family education, Balance training, Taping, Joint mobilization, Joint manipulation, Spinal manipulation, Spinal mobilization, Scar mobilization, Vestibular training, Cryotherapy, Moist heat, and Biofeedback  PLAN FOR NEXT SESSION: progress down training; diaphragmatic breathing training; core strengthening; dilator education  Josette Mares, PT, DPT10/29/251:16 PM [facility] [address] [place], [place] [address] Phone # [phone] Fax [phone]

## 2024-05-06 ENCOUNTER — Ambulatory Visit: Attending: Family Medicine

## 2024-05-06 DIAGNOSIS — R279 Unspecified lack of coordination: Secondary | ICD-10-CM | POA: Insufficient documentation

## 2024-05-06 DIAGNOSIS — M6281 Muscle weakness (generalized): Secondary | ICD-10-CM | POA: Diagnosis present

## 2024-05-06 DIAGNOSIS — R293 Abnormal posture: Secondary | ICD-10-CM | POA: Insufficient documentation

## 2024-05-06 DIAGNOSIS — R102 Pelvic and perineal pain unspecified side: Secondary | ICD-10-CM | POA: Diagnosis present

## 2024-05-06 DIAGNOSIS — M62838 Other muscle spasm: Secondary | ICD-10-CM | POA: Insufficient documentation

## 2024-05-06 NOTE — Patient Instructions (Signed)
 Vaginal trainers  Prior to Use:   Wash the vaginal trainer with soap and water before and after each use.   Use a water-soluble lubricant like Slippery Stuff or Surgulibe.   Avoid using Vaseline, coconut oil, or other oil-based lubricants. They are not water-soluble and can be irritating to the tissues in the vagina.   Do not use silicon-based lubricants with a silicon vaginal trainer. Using a siliconbased lubricant with a silicon device can contribute to break down of the material.  Setting Up Your Space   Work in a comfortable room lying on your back on a bed or couch with your knees bent and knees relaxed open. Use pillows underneath your knees as they are relaxed open and to support your upper back and head. Place a towel underneath your bottom to collect any lubricant.   Place your vaginal trainers and lubricant on a towel next to you within arm's reach for easy access.   Starting to use your trainer:  o Take 10-20 deep breaths to quiet your nervous system  o Perform stretches to help relax your hips and pelvic floor such as child's pose, cat/cow, or happy baby pose  Using Your Trainers   Coat the smallest vaginal trainer, or the size you are most comfortable using, with lubricant   Place the tip of the trainer at the opening to the vagina.   Take a few deep breaths to adjust to the sensation of the lubricant and trainer.   Slowly insert the rounded end of the trainer into your vaginal opening as far as you are comfortable. Pause and breathe if you experience pain, tension, or muscle guarding at any time. Once you feel comfortable gently slide trainer deeper into the vaginal canal as far as it will go without causing pain or discomfort.  Progressing with your Trainers   Gently press the trainer toward the bottom and sides of the vaginal opening to give it a gentle stretch. Pause and breathe at each spot and tension melts away.   Once fully inserted, turn the trainer clockwise and  counterclockwise to produce different sensations   Slowly move the trainer in and out as you breathe and focus on staying as relaxed as possible  To progress to the next size gradually, once one trainer is completely pain-free and comfortable to use, insert that smaller trainer first for 5-10 minutes and then follow with the next largest size trainer for 5-10 minutes. Gradually decrease the length of time using the smaller trainer as you increase the length of time using the larger trainer.   Move at your pace ad what's comfortable for you.  Wrapping up your session   Use trainers for 5-10 minutes every other day or 3 times a week   Wash and dry your vaginal trainers after use  Other considerations: Try to approach using vaginal trainers from a place of curiosity instead of judgment - what can my body do today, vs. why can't it do this, or I should be able to do this. Try letting go of that idea that it should be different, and try to meet yourself where you are at, without the pressure to change anything Do you bring your vaginal trainers into PT? Sometimes, bringing your trainers into sessions with your PT and having them talk you through the process while you are in control of the trainer can be helpful. Maybe they can help you find ways to make insertion a bit easier for you, or they can  help remind you to breathe. If you aren't doing this, I definitely recommend talking to a PT about it. Sometimes knowing the physical tools you have can help with the mental game. One thing that can be helpful to do before jumping to dilating is called "cupping". It is just taking your hand and holding your palm to your vulva and breathing. Doing this before doing any type of trainer training can be helpful as it lets you take a second and check in, vs jumping right in. Kind of like a warm up to your workout! Try different tools and see if you like another one more. Some of our patients prefer crystal wands or  plastic trainers to silicone, some prefer starting with a vibrating pelvic wand instead of a trainer, look at different options and see what interests you. You can also try different lubricants. And don't feel as though you need to jump right into inserting anything. The first few times (or minutes of the session) may just be about putting it at the entrance without inserting, and that's ok! We have also had patients find success with an external vibrator on their pubic bone while they use trainers as this can help distract nerves and increase muscle relaxation. This can be helpful to normalize the trainers. Leave the one you are currently using and the one you want to progress to somewhere you see it every day, like the bathroom counter or the bedside table. Seeing them every day can make them less intimidating. The more you do something, the more routine it becomes, so setting a vaginal trainers schedule and sticking to it can help make it less intimidating. Last thing that could be helpful to you is to set yourself up a relaxing environment when you use your vaginal trainers. Play your favorite calming music, light your favorite candle, incense, or turn on your diffuser, wear your coziest t-shirt and socks, prop your legs on pillows, anything that feels like a big exhale. Don't distract yourself with tv or a movie. Stay tuned into your body to help maintain relaxation Listening to relaxing music or meditations can also be helpful. Preferences for guided meditations can be so different from person to person so find one you feel relaxed/safe with!   Pelvic Floor Vaginal dilators                                                             Amielle Restore Vaginal Dilator Kit                                                        Vulva Tech                                                                              Restore  Soul Source                                                                                    Intimate Rose                                                                                        Inspire Silicone Dilator Set                                                                                 V Well  Buyer, retail that you pump                                                                               Conservation officer, historic buildings                                                                  Vaginismus Vaginal dilators  Oh Nut for deep vaginal penetration limitation  Most of these dilators you can get on [employer]. The ones you are not able to do then look at the company website or [email or web address].hunt.info/

## 2024-05-06 NOTE — Therapy (Signed)
 OUTPATIENT PHYSICAL THERAPY FEMALE PELVIC TREATMENT   Patient Name: Cynthia Cherry MRN: [id number] DOB:Aug 11, 1995, 28 y.o., female Today's Date: 05/06/2024  END OF SESSION:  PT End of Session - 05/06/24 0821     Visit Number 5    Date for Recertification  09/26/24    Authorization Type [employer]    PT Start Time [id number]    PT Stop Time 0845    PT Time Calculation (min) 26 min    Activity Tolerance Patient tolerated treatment well    Behavior During Therapy [facility] for tasks assessed/performed           Past Medical History:  Diagnosis Date   Anxiety    Depression    Gastritis    Helicobacter pylori (H. pylori) infection 2020   Treated with anbx in 2020   Past Surgical History:  Procedure Laterality Date   OTHER SURGICAL HISTORY  2010?   Reports needing to be opened up to have period when in high school. Since this procedure she has had regular menses. Unsure if this was inperforate hymen or cervical stenosis   POLYPECTOMY     Patient Active Problem List   Diagnosis Date Noted   Iron deficiency anemia 05/30/2022   Leukopenia 05/30/2022   Menometrorrhagia 04/01/2021   Dysmenorrhea 04/01/2021    PCP: Marikay Eva POUR, PA  REFERRING PROVIDER: Eldonna Suzen Octave, MD  REFERRING DIAG: [phone] (ICD-10-CM) - Left ovarian cyst N94.6 (ICD-10-CM) - Dysmenorrhea  THERAPY DIAG:  Pelvic pain  Other muscle spasm  Unspecified lack of coordination  Muscle weakness (generalized)  Abnormal posture  Rationale for Evaluation and Treatment: Rehabilitation  ONSET DATE: since getting menstrual cycle  SUBJECTIVE:                                                                                                                                                                                           SUBJECTIVE STATEMENT: Pt states that she had break through bleeding 3 days after last session. It has been the last two days and she is not sure about today. She has ordered  dilators but has not received them yet.   PAIN: 05/01/24 Are you having pain? Yes NPRS scale: 9/10 Pain location: Vaginal  Pain type: sharp Pain description: intermittent   Aggravating factors: something being in vaginal canal Relieving factors: nothing in vaginal canal  PRECAUTIONS: None  RED FLAGS: None   WEIGHT BEARING RESTRICTIONS: No  FALLS:  Has patient fallen in last 6 months? No  OCCUPATION: journalist  ACTIVITY LEVEL : exercises daily  PLOF: Independent  PATIENT GOALS: be pain free; would like to make sure she does not  have pain once she does to have intercourse  PERTINENT HISTORY:  Lt ovarian cyst, dysmenorrhea, anxiety, depression, gastritis Sexual abuse: No  BOWEL MOVEMENT: Pain with bowel movement: No Type of bowel movement:Frequency daily and Strain no Fully empty rectum: Yes:   Leakage: No Pads: No Fiber supplement/laxative No  URINATION: Pain with urination: No Fully empty bladder: Yes:   Stream: Strong Urgency: Yes - started recently - very hard to make it to the bathroom Frequency: every 2 hours  Fluid Intake: 84oz of water Leakage: Urge to void and Walking to the bathroom Pads: No  INTERCOURSE: Abstains   PREGNANCY: NA  PROLAPSE: None   OBJECTIVE:  Note: Objective measures were completed at Evaluation unless otherwise noted.  04/11/24 PATIENT SURVEYS:   PFIQ-7: 43  COGNITION: Overall cognitive status: Within functional limits for tasks assessed     SENSATION: Light touch: Appears intact   FUNCTIONAL TESTS:  Squat: WNL Single leg stance:  Rt: stable  Lt: stable Curl-up test: distortion Sit-up test: 1/3   GAIT: Assistive device utilized: None Comments: WNL  POSTURE: rounded shoulders, forward head, increased lumbar lordosis, increased thoracic kyphosis, and anterior pelvic tilt   LUMBARAROM/PROM:  A/PROM A/PROM  Eval (% available)  Flexion 100  Extension 50  Right lateral flexion 75  Left lateral  flexion 75  Right rotation 50  Left rotation 50   (Blank rows = not tested)  PALPATION:   General: WNL  Pelvic Alignment: significant anterior pelvic tilt, posterior placement of rib cage compared to anterior pelvis  Abdominal: tenderness throughout bil lower abdomen                External Perineal Exam: overall perineal bulge with bearing down                             Internal Pelvic Floor: introital tightness and discomfort bil, Lt>Rt; increased bil deep pelvic floor muscle restriction, Rt>Lt  Patient confirms identification and approves PT to assess internal pelvic floor and treatment Yes  PELVIC MMT:   MMT eval  Vaginal 2/5, 5 seconds, 3 repeat contractions, poor coordination of contract/relax  Diastasis Recti 2 finger width separation  (Blank rows = not tested)        TONE: high  PROLAPSE: Perineal bulge with bearing down  TODAY'S TREATMENT:                                                                                                                              DATE:  05/06/24 Manual: Pt provides verbal consent for internal vaginal/rectal pelvic floor exam. Internal vaginal superficial and deep pelvic floor muscle release to tolerance Therapeutic activities: Reviewed dilator use and education - print out given   05/01/24 Manual: Pt provides verbal consent for internal vaginal/rectal pelvic floor exam. Internal vaginal superficial and deep pelvic floor muscle release to tolerance Supine abdominal soft tissue mobilization  Therapeutic activities: Pt  education on tracking cycles, noting pain, when she had pelvic floor muscle release, clotting, and heaviness Pt education on how pelvic floor muscle release in session cannot impact clotting and bleeding during cycles, but could certainly impact pain that she is having - since cycle started day after first pelvic floor muscle release, this could have been residual soreness  04/22/24 Manual: Pt provides verbal  consent for internal vaginal/rectal pelvic floor exam. Internal vaginal superficial and deep pelvic floor muscle release to tolerance Neuromuscular re-education: Diaphragmatic breathing for improved pelvic floor muscle lengthening and desensitization Therapeutic activities: Continued dilator education - recommended silicone dilators  Lubricant discussion - only water based with silicone dilators - samples given  Pelvic floor muscle education and how it may be beneficial for deeper pelvic floor muscle release    PATIENT EDUCATION:  Education details: See above Person educated: Patient Education method: Explanation, Demonstration, Tactile cues, Verbal cues, and Handouts Education comprehension: verbalized understanding  HOME EXERCISE PROGRAM: [id number]  ASSESSMENT:  CLINICAL IMPRESSION:  Patient is a 28 y.o. female who was seen today for physical therapy treatment for vaginal pain with gynecological exams and vaginal insertion. Pt 20 minutes late for session today. Since she has purchased dilators, we reviewed how to use them and handout was given. We continued superficial pelvic floor muscle release to patient tolerance. Deeper layers continue to feel much better, but she has tight band of tissue that is very painful right inside introitus. She will continue to benefit from skilled PT intervention in order to decrease pain with vaginal penetration, prepare for pain free intercourse, decrease urinary urgency, address impairments, and improve quality of life.   OBJECTIVE IMPAIRMENTS: decreased activity tolerance, decreased coordination, decreased endurance, decreased mobility, decreased ROM, decreased strength, increased fascial restrictions, increased muscle spasms, impaired flexibility, impaired tone, improper body mechanics, postural dysfunction, and pain.   ACTIVITY LIMITATIONS: continence and vaginal penetration  PARTICIPATION LIMITATIONS: gynecological care, interpersonal  relationship  PERSONAL FACTORS: 1 comorbidity: medical history are also affecting patient's functional outcome.   REHAB POTENTIAL: Good  CLINICAL DECISION MAKING: Stable/uncomplicated  EVALUATION COMPLEXITY: Low   GOALS: Goals reviewed with patient? Yes  SHORT TERM GOALS: Target date: 05/09/2024   Pt will be independent with HEP in order to improve activity tolerance.   Baseline: Goal status: INITIAL  2.  Pt will begin dilator program and use 3x/week in order to decrease vaginal tightness and sensitivity.   Baseline: no dilator program Goal status: INITIAL  3.  Pt will increase all impaired lumbar A/ROM by 25% without pain in order to improve length tension relationship in pelvic floor muscles, decreasing urinary urgency and incontinence.    Baseline:  Goal status: INITIAL  4.  Pt will be able to perform curl-up test without abdominal distortion in order to decrease bearing down on pelvic floor to improve urinary urgency.  Baseline:  Goal status: INITIAL  5.  Pt will be able to teach back and utilize urge suppression technique in order to help reduce number of trips to the bathroom.    Baseline:  Goal status: INITIAL    LONG TERM GOALS: Target date: 09/27/23  Pt will be independent with advanced HEP in order to improve activity tolerance.   Baseline:  Goal status: INITIAL  2.  Pt will report 0/10 pain with vaginal penetration in order to have comfortable gynecological exam and necessary preventative care.  Baseline: 9/10 Goal status: INITIAL  3.  Pt will be independent with dilator program in order to achieve pain free  vaginal penetration. Baseline: 9/10 pain Goal status: INITIAL  4.  Pt will be able to go 2-3 hours in between voids without urgency or incontinence in order to improve QOL and perform all functional activities with less difficulty.   Baseline: sudden urgency and leaking on the way to the bathroom Goal status: INITIAL  5.  Pt will be able to  perform 3/3 sit-up test without abdominal distortion in order to decrease bearing down on pelvic floor to decrease urinary urgency.  Baseline: 1/3 Goal status: INITIAL  PLAN:  PT FREQUENCY: 1-2x/week  PT DURATION: 16 visits   PLANNED INTERVENTIONS: 97164- PT Re-evaluation, 97110-Therapeutic exercises, 97530- Therapeutic activity, 97112- Neuromuscular re-education, 97535- Self Care, 02859- Manual therapy, [phone]- Gait training, [phone]- Aquatic Therapy, [phone]- Electrical stimulation (unattended), [phone]- Traction (mechanical), [id number]- Ionotophoresis 4mg /ml Dexamethasone, 20560 (1-2 muscles), 20561 (3+ muscles)- Dry Needling, Patient/Family education, Balance training, Taping, Joint mobilization, Joint manipulation, Spinal manipulation, Spinal mobilization, Scar mobilization, Vestibular training, Cryotherapy, Moist heat, and Biofeedback  PLAN FOR NEXT SESSION: progress down training; diaphragmatic breathing training; core strengthening; dilator education  Josette Mares, PT, DPT11/03/258:44 AM [facility] [address] [place], [place] [address] Phone # [phone] Fax [phone]

## 2024-05-16 ENCOUNTER — Ambulatory Visit: Payer: Self-pay

## 2024-05-16 DIAGNOSIS — R102 Pelvic and perineal pain unspecified side: Secondary | ICD-10-CM | POA: Diagnosis not present

## 2024-05-16 DIAGNOSIS — R279 Unspecified lack of coordination: Secondary | ICD-10-CM

## 2024-05-16 DIAGNOSIS — M6281 Muscle weakness (generalized): Secondary | ICD-10-CM

## 2024-05-16 DIAGNOSIS — M62838 Other muscle spasm: Secondary | ICD-10-CM

## 2024-05-16 DIAGNOSIS — R293 Abnormal posture: Secondary | ICD-10-CM

## 2024-05-16 NOTE — Therapy (Signed)
 OUTPATIENT PHYSICAL THERAPY FEMALE PELVIC TREATMENT   Patient Name: Cynthia Cherry MRN: [id number] DOB:07/25/1995, 28 y.o., female Today's Date: 05/16/2024  END OF SESSION:  PT End of Session - 05/16/24 0936     Visit Number 6    Date for Recertification  09/26/24    Authorization Type [employer]    PT Start Time 0932    PT Stop Time 1010    PT Time Calculation (min) 38 min    Activity Tolerance Patient tolerated treatment well    Behavior During Therapy WFL for tasks assessed/performed           Past Medical History:  Diagnosis Date   Anxiety    Depression    Gastritis    Helicobacter pylori (H. pylori) infection 2020   Treated with anbx in 2020   Past Surgical History:  Procedure Laterality Date   OTHER SURGICAL HISTORY  2010?   Reports needing to be opened up to have period when in high school. Since this procedure she has had regular menses. Unsure if this was inperforate hymen or cervical stenosis   POLYPECTOMY     Patient Active Problem List   Diagnosis Date Noted   Iron deficiency anemia 05/30/2022   Leukopenia 05/30/2022   Menometrorrhagia 04/01/2021   Dysmenorrhea 04/01/2021    PCP: Marikay Eva POUR, PA  REFERRING PROVIDER: Eldonna Suzen Octave, MD  REFERRING DIAG: [phone] (ICD-10-CM) - Left ovarian cyst N94.6 (ICD-10-CM) - Dysmenorrhea  THERAPY DIAG:  Pelvic pain  Other muscle spasm  Unspecified lack of coordination  Muscle weakness (generalized)  Abnormal posture  Rationale for Evaluation and Treatment: Rehabilitation  ONSET DATE: since getting menstrual cycle  SUBJECTIVE:                                                                                                                                                                                           SUBJECTIVE STATEMENT: Pt did get dilators and has started using. She was using the smallest size in the large set. She was surprised at the discomfort that she had. She used  them yesterday and has some soreness today.   PAIN: 05/16/24 Are you having pain? Yes NPRS scale: 9/10 Pain location: Vaginal  Pain type: sharp Pain description: intermittent   Aggravating factors: something being in vaginal canal Relieving factors: nothing in vaginal canal  PRECAUTIONS: None  RED FLAGS: None   WEIGHT BEARING RESTRICTIONS: No  FALLS:  Has patient fallen in last 6 months? No  OCCUPATION: journalist  ACTIVITY LEVEL : exercises daily  PLOF: Independent  PATIENT GOALS: be pain free; would like to make sure she does not have  pain once she does to have intercourse  PERTINENT HISTORY:  Lt ovarian cyst, dysmenorrhea, anxiety, depression, gastritis Sexual abuse: No  BOWEL MOVEMENT: Pain with bowel movement: No Type of bowel movement:Frequency daily and Strain no Fully empty rectum: Yes:   Leakage: No Pads: No Fiber supplement/laxative No  URINATION: Pain with urination: No Fully empty bladder: Yes:   Stream: Strong Urgency: Yes - started recently - very hard to make it to the bathroom Frequency: every 2 hours  Fluid Intake: 84oz of water Leakage: Urge to void and Walking to the bathroom Pads: No  INTERCOURSE: Abstains   PREGNANCY: NA  PROLAPSE: None   OBJECTIVE:  Note: Objective measures were completed at Evaluation unless otherwise noted.  04/11/24 PATIENT SURVEYS:   PFIQ-7: 43  COGNITION: Overall cognitive status: Within functional limits for tasks assessed     SENSATION: Light touch: Appears intact   FUNCTIONAL TESTS:  Squat: WNL Single leg stance:  Rt: stable  Lt: stable Curl-up test: distortion Sit-up test: 1/3   GAIT: Assistive device utilized: None Comments: WNL  POSTURE: rounded shoulders, forward head, increased lumbar lordosis, increased thoracic kyphosis, and anterior pelvic tilt   LUMBARAROM/PROM:  A/PROM A/PROM  Eval (% available)  Flexion 100  Extension 50  Right lateral flexion 75  Left lateral  flexion 75  Right rotation 50  Left rotation 50   (Blank rows = not tested)  PALPATION:   General: WNL  Pelvic Alignment: significant anterior pelvic tilt, posterior placement of rib cage compared to anterior pelvis  Abdominal: tenderness throughout bil lower abdomen                External Perineal Exam: overall perineal bulge with bearing down                             Internal Pelvic Floor: introital tightness and discomfort bil, Lt>Rt; increased bil deep pelvic floor muscle restriction, Rt>Lt  Patient confirms identification and approves PT to assess internal pelvic floor and treatment Yes  PELVIC MMT:   MMT eval  Vaginal 2/5, 5 seconds, 3 repeat contractions, poor coordination of contract/relax  Diastasis Recti 2 finger width separation  (Blank rows = not tested)        TONE: high  PROLAPSE: Perineal bulge with bearing down  TODAY'S TREATMENT:                                                                                                                              DATE:  05/16/24 Neuromuscular re-ed: Diaphragmatic breathing for improved pelvic floor muscle relaxation Manual: Pt provides verbal consent for internal vaginal/rectal pelvic floor exam. Internal vaginal superficial and deep pelvic floor muscle release to tolerance Therapeutic activities: Reviewed dilator use and education  05/06/24 Manual: Pt provides verbal consent for internal vaginal/rectal pelvic floor exam. Internal vaginal superficial and deep pelvic floor muscle release to tolerance Therapeutic activities: Reviewed dilator use  and education - print out given   05/01/24 Manual: Pt provides verbal consent for internal vaginal/rectal pelvic floor exam. Internal vaginal superficial and deep pelvic floor muscle release to tolerance Supine abdominal soft tissue mobilization  Therapeutic activities: Pt education on tracking cycles, noting pain, when she had pelvic floor muscle release,  clotting, and heaviness Pt education on how pelvic floor muscle release in session cannot impact clotting and bleeding during cycles, but could certainly impact pain that she is having - since cycle started day after first pelvic floor muscle release, this could have been residual soreness    PATIENT EDUCATION:  Education details: See above Person educated: Patient Education method: Explanation, Demonstration, Tactile cues, Verbal cues, and Handouts Education comprehension: verbalized understanding  HOME EXERCISE PROGRAM: [id number]  ASSESSMENT:  CLINICAL IMPRESSION:  Patient is a 28 y.o. female who was seen today for physical therapy treatment for vaginal pain with gynecological exams and vaginal insertion. Pt has started working with dilators; we reviewed and answered questions pertaining to this to help improve and manage expectations for progression. During manual techniques today, she demonstrated great improvements in muscle tone and decreased sensitivity. She will continue to benefit from skilled PT intervention in order to decrease pain with vaginal penetration, prepare for pain free intercourse, decrease urinary urgency, address impairments, and improve quality of life.   OBJECTIVE IMPAIRMENTS: decreased activity tolerance, decreased coordination, decreased endurance, decreased mobility, decreased ROM, decreased strength, increased fascial restrictions, increased muscle spasms, impaired flexibility, impaired tone, improper body mechanics, postural dysfunction, and pain.   ACTIVITY LIMITATIONS: continence and vaginal penetration  PARTICIPATION LIMITATIONS: gynecological care, interpersonal relationship  PERSONAL FACTORS: 1 comorbidity: medical history are also affecting patient's functional outcome.   REHAB POTENTIAL: Good  CLINICAL DECISION MAKING: Stable/uncomplicated  EVALUATION COMPLEXITY: Low   GOALS: Goals reviewed with patient? Yes  SHORT TERM GOALS: Target date:  05/09/2024   Pt will be independent with HEP in order to improve activity tolerance.   Baseline: Goal status: MET 05/16/24  2.  Pt will begin dilator program and use 3x/week in order to decrease vaginal tightness and sensitivity.   Baseline: no dilator program Goal status: MET 05/16/24  3.  Pt will increase all impaired lumbar A/ROM by 25% without pain in order to improve length tension relationship in pelvic floor muscles, decreasing urinary urgency and incontinence.    Baseline:  Goal status: IN PROGRESS 05/16/24  4.  Pt will be able to perform curl-up test without abdominal distortion in order to decrease bearing down on pelvic floor to improve urinary urgency.  Baseline:  Goal status:  IN PROGRESS 05/16/24  5.  Pt will be able to teach back and utilize urge suppression technique in order to help reduce number of trips to the bathroom.    Baseline:  Goal status: MET 05/16/24    LONG TERM GOALS: Target date: 09/27/23  Pt will be independent with advanced HEP in order to improve activity tolerance.   Baseline:  Goal status:  IN PROGRESS 05/16/24  2.  Pt will report 0/10 pain with vaginal penetration in order to have comfortable gynecological exam and necessary preventative care.  Baseline: 9/10 Goal status:  IN PROGRESS 05/16/24  3.  Pt will be independent with dilator program in order to achieve pain free vaginal penetration. Baseline: 9/10 pain Goal status:  IN PROGRESS 05/16/24  4.  Pt will be able to go 2-3 hours in between voids without urgency or incontinence in order to improve QOL and perform all functional  activities with less difficulty.   Baseline: sudden urgency and leaking on the way to the bathroom Goal status:  IN PROGRESS 05/16/24  5.  Pt will be able to perform 3/3 sit-up test without abdominal distortion in order to decrease bearing down on pelvic floor to decrease urinary urgency.  Baseline: 1/3 Goal status:  IN PROGRESS 05/16/24  PLAN:  PT  FREQUENCY: 1-2x/week  PT DURATION: 16 visits   PLANNED INTERVENTIONS: 97164- PT Re-evaluation, 97110-Therapeutic exercises, 97530- Therapeutic activity, 97112- Neuromuscular re-education, 97535- Self Care, 02859- Manual therapy, [phone]- Gait training, [phone]- Aquatic Therapy, [phone]- Electrical stimulation (unattended), [phone]- Traction (mechanical), [id number]- Ionotophoresis 4mg /ml Dexamethasone, 79439 (1-2 muscles), 20561 (3+ muscles)- Dry Needling, Patient/Family education, Balance training, Taping, Joint mobilization, Joint manipulation, Spinal manipulation, Spinal mobilization, Scar mobilization, Vestibular training, Cryotherapy, Moist heat, and Biofeedback  PLAN FOR NEXT SESSION: progress down training; diaphragmatic breathing training; core strengthening; dilator education  Josette Mares, PT, DPT11/13/259:37 AM [facility] [address] [place], [place] [address] Phone # [phone] Fax [phone]

## 2024-05-20 ENCOUNTER — Ambulatory Visit: Payer: Self-pay

## 2024-05-20 DIAGNOSIS — R279 Unspecified lack of coordination: Secondary | ICD-10-CM

## 2024-05-20 DIAGNOSIS — R293 Abnormal posture: Secondary | ICD-10-CM

## 2024-05-20 DIAGNOSIS — R102 Pelvic and perineal pain unspecified side: Secondary | ICD-10-CM

## 2024-05-20 DIAGNOSIS — M62838 Other muscle spasm: Secondary | ICD-10-CM

## 2024-05-20 DIAGNOSIS — M6281 Muscle weakness (generalized): Secondary | ICD-10-CM

## 2024-05-20 NOTE — Therapy (Signed)
 OUTPATIENT PHYSICAL THERAPY FEMALE PELVIC TREATMENT   Patient Name: Cynthia Cherry MRN: [id number] DOB:1996-04-25, 28 y.o., female Today's Date: 05/20/2024  END OF SESSION:  PT End of Session - 05/20/24 1022     Visit Number 7    Date for Recertification  09/26/24    Authorization Type [employer]    PT Start Time 1020    PT Stop Time 1058    PT Time Calculation (min) 38 min    Activity Tolerance Patient tolerated treatment well    Behavior During Therapy WFL for tasks assessed/performed           Past Medical History:  Diagnosis Date   Anxiety    Depression    Gastritis    Helicobacter pylori (H. pylori) infection 2020   Treated with anbx in 2020   Past Surgical History:  Procedure Laterality Date   OTHER SURGICAL HISTORY  2010?   Reports needing to be opened up to have period when in high school. Since this procedure she has had regular menses. Unsure if this was inperforate hymen or cervical stenosis   POLYPECTOMY     Patient Active Problem List   Diagnosis Date Noted   Iron deficiency anemia 05/30/2022   Leukopenia 05/30/2022   Menometrorrhagia 04/01/2021   Dysmenorrhea 04/01/2021    PCP: Marikay Eva POUR, PA  REFERRING PROVIDER: Eldonna Suzen Octave, MD  REFERRING DIAG: [phone] (ICD-10-CM) - Left ovarian cyst N94.6 (ICD-10-CM) - Dysmenorrhea  THERAPY DIAG:  Pelvic pain  Other muscle spasm  Unspecified lack of coordination  Muscle weakness (generalized)  Abnormal posture  Rationale for Evaluation and Treatment: Rehabilitation  ONSET DATE: since getting menstrual cycle  SUBJECTIVE:                                                                                                                                                                                           SUBJECTIVE STATEMENT: Pt states that she got a lot of cramping after the last session. She had a better experience with dilator. She reports having the most discomfort with  turning and it created more burning/stinging.   PAIN: 05/20/24 Are you having pain? Yes NPRS scale: 5/10 Pain location: Vaginal using dilator   Pain type: sharp Pain description: intermittent   Aggravating factors: something being in vaginal canal Relieving factors: nothing in vaginal canal  PRECAUTIONS: None  RED FLAGS: None   WEIGHT BEARING RESTRICTIONS: No  FALLS:  Has patient fallen in last 6 months? No  OCCUPATION: journalist  ACTIVITY LEVEL : exercises daily  PLOF: Independent  PATIENT GOALS: be pain free; would like to make sure she does not have  pain once she does to have intercourse  PERTINENT HISTORY:  Lt ovarian cyst, dysmenorrhea, anxiety, depression, gastritis Sexual abuse: No  BOWEL MOVEMENT: Pain with bowel movement: No Type of bowel movement:Frequency daily and Strain no Fully empty rectum: Yes:   Leakage: No Pads: No Fiber supplement/laxative No  URINATION: Pain with urination: No Fully empty bladder: Yes:   Stream: Strong Urgency: Yes - started recently - very hard to make it to the bathroom Frequency: every 2 hours  Fluid Intake: 84oz of water Leakage: Urge to void and Walking to the bathroom Pads: No  INTERCOURSE: Abstains   PREGNANCY: NA  PROLAPSE: None   OBJECTIVE:  Note: Objective measures were completed at Evaluation unless otherwise noted.  04/11/24 PATIENT SURVEYS:   PFIQ-7: 43  COGNITION: Overall cognitive status: Within functional limits for tasks assessed     SENSATION: Light touch: Appears intact   FUNCTIONAL TESTS:  Squat: WNL Single leg stance:  Rt: stable  Lt: stable Curl-up test: distortion Sit-up test: 1/3   GAIT: Assistive device utilized: None Comments: WNL  POSTURE: rounded shoulders, forward head, increased lumbar lordosis, increased thoracic kyphosis, and anterior pelvic tilt   LUMBARAROM/PROM:  A/PROM A/PROM  Eval (% available)  Flexion 100  Extension 50  Right lateral flexion  75  Left lateral flexion 75  Right rotation 50  Left rotation 50   (Blank rows = not tested)  PALPATION:   General: WNL  Pelvic Alignment: significant anterior pelvic tilt, posterior placement of rib cage compared to anterior pelvis  Abdominal: tenderness throughout bil lower abdomen                External Perineal Exam: overall perineal bulge with bearing down                             Internal Pelvic Floor: introital tightness and discomfort bil, Lt>Rt; increased bil deep pelvic floor muscle restriction, Rt>Lt  Patient confirms identification and approves PT to assess internal pelvic floor and treatment Yes  PELVIC MMT:   MMT eval  Vaginal 2/5, 5 seconds, 3 repeat contractions, poor coordination of contract/relax  Diastasis Recti 2 finger width separation  (Blank rows = not tested)        TONE: high  PROLAPSE: Perineal bulge with bearing down  TODAY'S TREATMENT:                                                                                                                              DATE:  05/20/24 Neuromuscular re-education: Transversus abdominus training with multimodal cues for improved motor control and breath coordination Supine hip adduction ball press with transversus abdominus and pelvic floor muscle contractions and breath coordination 10x Bridge with hip adduction, transversus abdominus, and pelvic floor muscle 2 x 10 Supine modified dead bug 10x bil Bird dog 2 x 10 Bear plank lifts 2 x 10 Modified side plank + clam shell  10x bil  05/16/24 Neuromuscular re-ed: Diaphragmatic breathing for improved pelvic floor muscle relaxation Manual: Pt provides verbal consent for internal vaginal/rectal pelvic floor exam. Internal vaginal superficial and deep pelvic floor muscle release to tolerance Therapeutic activities: Reviewed dilator use and education  05/06/24 Manual: Pt provides verbal consent for internal vaginal/rectal pelvic floor exam. Internal  vaginal superficial and deep pelvic floor muscle release to tolerance Therapeutic activities: Reviewed dilator use and education - print out given    PATIENT EDUCATION:  Education details: See above Person educated: Patient Education method: Programmer, Multimedia, Demonstration, Tactile cues, Verbal cues, and Handouts Education comprehension: verbalized understanding  HOME EXERCISE PROGRAM: [id number]  ASSESSMENT:  CLINICAL IMPRESSION:  Patient is a 28 y.o. female who was seen today for physical therapy treatment for vaginal pain with gynecological exams and vaginal insertion. Due to progress and now feeling good with dilators, we decided to continue sessions, but at decreased frequency of 1x every 3 weeks to allow her the opportunity to work with dilators. We started core training today so she would also be able to begin working on these activities until her next appointment. She will continue to benefit from skilled PT intervention in order to decrease pain with vaginal penetration, prepare for pain free intercourse, decrease urinary urgency, address impairments, and improve quality of life.   OBJECTIVE IMPAIRMENTS: decreased activity tolerance, decreased coordination, decreased endurance, decreased mobility, decreased ROM, decreased strength, increased fascial restrictions, increased muscle spasms, impaired flexibility, impaired tone, improper body mechanics, postural dysfunction, and pain.   ACTIVITY LIMITATIONS: continence and vaginal penetration  PARTICIPATION LIMITATIONS: gynecological care, interpersonal relationship  PERSONAL FACTORS: 1 comorbidity: medical history are also affecting patient's functional outcome.   REHAB POTENTIAL: Good  CLINICAL DECISION MAKING: Stable/uncomplicated  EVALUATION COMPLEXITY: Low   GOALS: Goals reviewed with patient? Yes  SHORT TERM GOALS: Target date: 05/09/2024   Pt will be independent with HEP in order to improve activity tolerance.    Baseline: Goal status: MET 05/16/24  2.  Pt will begin dilator program and use 3x/week in order to decrease vaginal tightness and sensitivity.   Baseline: no dilator program Goal status: MET 05/16/24  3.  Pt will increase all impaired lumbar A/ROM by 25% without pain in order to improve length tension relationship in pelvic floor muscles, decreasing urinary urgency and incontinence.    Baseline:  Goal status: IN PROGRESS 05/16/24  4.  Pt will be able to perform curl-up test without abdominal distortion in order to decrease bearing down on pelvic floor to improve urinary urgency.  Baseline:  Goal status:  IN PROGRESS 05/16/24  5.  Pt will be able to teach back and utilize urge suppression technique in order to help reduce number of trips to the bathroom.    Baseline:  Goal status: MET 05/16/24    LONG TERM GOALS: Target date: 09/27/23  Pt will be independent with advanced HEP in order to improve activity tolerance.   Baseline:  Goal status:  IN PROGRESS 05/16/24  2.  Pt will report 0/10 pain with vaginal penetration in order to have comfortable gynecological exam and necessary preventative care.  Baseline: 9/10 Goal status:  IN PROGRESS 05/16/24  3.  Pt will be independent with dilator program in order to achieve pain free vaginal penetration. Baseline: 9/10 pain Goal status:  IN PROGRESS 05/16/24  4.  Pt will be able to go 2-3 hours in between voids without urgency or incontinence in order to improve QOL and perform all functional activities with less difficulty.  Baseline: sudden urgency and leaking on the way to the bathroom Goal status:  IN PROGRESS 05/16/24  5.  Pt will be able to perform 3/3 sit-up test without abdominal distortion in order to decrease bearing down on pelvic floor to decrease urinary urgency.  Baseline: 1/3 Goal status:  IN PROGRESS 05/16/24  PLAN:  PT FREQUENCY: 1-2x/week  PT DURATION: 16 visits   PLANNED INTERVENTIONS: 97164- PT  Re-evaluation, 97110-Therapeutic exercises, 97530- Therapeutic activity, 97112- Neuromuscular re-education, 97535- Self Care, 02859- Manual therapy, [phone]- Gait training, [phone]- Aquatic Therapy, [phone]- Electrical stimulation (unattended), [phone]- Traction (mechanical), [id number]- Ionotophoresis 4mg /ml Dexamethasone, 79439 (1-2 muscles), 20561 (3+ muscles)- Dry Needling, Patient/Family education, Balance training, Taping, Joint mobilization, Joint manipulation, Spinal manipulation, Spinal mobilization, Scar mobilization, Vestibular training, Cryotherapy, Moist heat, and Biofeedback  PLAN FOR NEXT SESSION: progress down training; diaphragmatic breathing training; core strengthening; dilator education  Josette Mares, PT, DPT11/17/2510:23 AM [facility] [address] [place], [place] [address] Phone # [phone] Fax [phone]

## 2024-06-11 ENCOUNTER — Ambulatory Visit: Payer: Self-pay | Attending: Family Medicine

## 2024-06-11 DIAGNOSIS — R293 Abnormal posture: Secondary | ICD-10-CM | POA: Insufficient documentation

## 2024-06-11 DIAGNOSIS — R102 Pelvic and perineal pain unspecified side: Secondary | ICD-10-CM | POA: Diagnosis present

## 2024-06-11 DIAGNOSIS — M6281 Muscle weakness (generalized): Secondary | ICD-10-CM | POA: Insufficient documentation

## 2024-06-11 DIAGNOSIS — R279 Unspecified lack of coordination: Secondary | ICD-10-CM | POA: Insufficient documentation

## 2024-06-11 DIAGNOSIS — M62838 Other muscle spasm: Secondary | ICD-10-CM | POA: Insufficient documentation

## 2024-06-11 NOTE — Therapy (Signed)
 OUTPATIENT PHYSICAL THERAPY FEMALE PELVIC TREATMENT   Patient Name: Cynthia Cherry MRN: [id number] DOB:08-May-1996, 28 y.o., female Today's Date: 06/11/2024  END OF SESSION:  PT End of Session - 06/11/24 0939     Visit Number 8    Date for Recertification  09/26/24    Authorization Type [employer]    PT Start Time [phone]    PT Stop Time 1017    PT Time Calculation (min) 38 min    Activity Tolerance Patient tolerated treatment well    Behavior During Therapy [facility] for tasks assessed/performed           Past Medical History:  Diagnosis Date   Anxiety    Depression    Gastritis    Helicobacter pylori (H. pylori) infection 2020   Treated with anbx in 2020   Past Surgical History:  Procedure Laterality Date   OTHER SURGICAL HISTORY  2010?   Reports needing to be opened up to have period when in high school. Since this procedure she has had regular menses. Unsure if this was inperforate hymen or cervical stenosis   POLYPECTOMY     Patient Active Problem List   Diagnosis Date Noted   Iron deficiency anemia 05/30/2022   Leukopenia 05/30/2022   Menometrorrhagia 04/01/2021   Dysmenorrhea 04/01/2021    PCP: Marikay Eva POUR, PA  REFERRING PROVIDER: Eldonna Suzen Octave, MD  REFERRING DIAG: [phone] (ICD-10-CM) - Left ovarian cyst N94.6 (ICD-10-CM) - Dysmenorrhea  THERAPY DIAG:  Pelvic pain  Other muscle spasm  Unspecified lack of coordination  Muscle weakness (generalized)  Abnormal posture  Rationale for Evaluation and Treatment: Rehabilitation  ONSET DATE: since getting menstrual cycle  SUBJECTIVE:                                                                                                                                                                                           SUBJECTIVE STATEMENT: Pt states that her last menstrual cycle was better. She has been good with routine of exercises and using dilators. She has not moved up sizes in  dilators, but plans to soon.   PAIN: 06/11/24 Are you having pain? Yes NPRS scale: 3/10 Pain location: Vaginal using dilator   Pain type: sharp Pain description: intermittent   Aggravating factors: something being in vaginal canal Relieving factors: nothing in vaginal canal  PRECAUTIONS: None  RED FLAGS: None   WEIGHT BEARING RESTRICTIONS: No  FALLS:  Has patient fallen in last 6 months? No  OCCUPATION: journalist  ACTIVITY LEVEL : exercises daily  PLOF: Independent  PATIENT GOALS: be pain free; would like to make sure she does not have pain  once she does to have intercourse  PERTINENT HISTORY:  Lt ovarian cyst, dysmenorrhea, anxiety, depression, gastritis Sexual abuse: No  BOWEL MOVEMENT: Pain with bowel movement: No Type of bowel movement:Frequency daily and Strain no Fully empty rectum: Yes:   Leakage: No Pads: No Fiber supplement/laxative No  URINATION: Pain with urination: No Fully empty bladder: Yes:   Stream: Strong Urgency: Yes - started recently - very hard to make it to the bathroom Frequency: every 2 hours  Fluid Intake: 84oz of water Leakage: Urge to void and Walking to the bathroom Pads: No  INTERCOURSE: Abstains   PREGNANCY: NA  PROLAPSE: None   OBJECTIVE:  Note: Objective measures were completed at Evaluation unless otherwise noted.  04/11/24 PATIENT SURVEYS:   PFIQ-7: 43  COGNITION: Overall cognitive status: Within functional limits for tasks assessed     SENSATION: Light touch: Appears intact   FUNCTIONAL TESTS:  Squat: WNL Single leg stance:  Rt: stable  Lt: stable Curl-up test: distortion Sit-up test: 1/3   GAIT: Assistive device utilized: None Comments: WNL  POSTURE: rounded shoulders, forward head, increased lumbar lordosis, increased thoracic kyphosis, and anterior pelvic tilt   LUMBARAROM/PROM:  A/PROM A/PROM  Eval (% available)  Flexion 100  Extension 50  Right lateral flexion 75  Left lateral  flexion 75  Right rotation 50  Left rotation 50   (Blank rows = not tested)  PALPATION:   General: WNL  Pelvic Alignment: significant anterior pelvic tilt, posterior placement of rib cage compared to anterior pelvis  Abdominal: tenderness throughout bil lower abdomen                External Perineal Exam: overall perineal bulge with bearing down                             Internal Pelvic Floor: introital tightness and discomfort bil, Lt>Rt; increased bil deep pelvic floor muscle restriction, Rt>Lt  Patient confirms identification and approves PT to assess internal pelvic floor and treatment Yes  PELVIC MMT:   MMT eval  Vaginal 2/5, 5 seconds, 3 repeat contractions, poor coordination of contract/relax  Diastasis Recti 2 finger width separation  (Blank rows = not tested)        TONE: high  PROLAPSE: Perineal bulge with bearing down  TODAY'S TREATMENT:                                                                                                                              DATE:  06/11/24 Neuromuscular re-education: Seated diagonal ball press 10x bil Seated horizontal abduction/extension + green band 2 x 10 Seated resisted rotation + green band 2 x 10 bil Seated D2 PNF + green band 2 x 10 bil Standing march + 90 degree shoulder flexion + 5 lbs 2 x 10 Therapeutic activities: Squats to table + 5 lbs 2 x 10 3 way kick 10x each, bil Pallof press +  green band 2 x 10   05/20/24 Neuromuscular re-education: Transversus abdominus training with multimodal cues for improved motor control and breath coordination Supine hip adduction ball press with transversus abdominus and pelvic floor muscle contractions and breath coordination 10x Bridge with hip adduction, transversus abdominus, and pelvic floor muscle 2 x 10 Supine modified dead bug 10x bil Bird dog 2 x 10 Bear plank lifts 2 x 10 Modified side plank + clam shell 10x bil  05/16/24 Neuromuscular re-ed: Diaphragmatic  breathing for improved pelvic floor muscle relaxation Manual: Pt provides verbal consent for internal vaginal/rectal pelvic floor exam. Internal vaginal superficial and deep pelvic floor muscle release to tolerance Therapeutic activities: Reviewed dilator use and education    PATIENT EDUCATION:  Education details: See above Person educated: Patient Education method: Programmer, Multimedia, Demonstration, Tactile cues, Verbal cues, and Handouts Education comprehension: verbalized understanding  HOME EXERCISE PROGRAM: [id number]  ASSESSMENT:  CLINICAL IMPRESSION:  Patient is a 28 y.o. female who was seen today for physical therapy treatment for vaginal pain with gynecological exams and vaginal insertion. Pt doing very well with having less pain during dilator use and being very compliant with HEP. We focused on progressing seated and standing strengthening activities today. We spent time working on improved neutrality of lumbar spine, especially in squats due to large increase in lumbar extension. She was able to make good corrections with this moving through squats and other standing exercises. She was encouraged to practice standing pelvic tilts to help with her proprioception. She will continue to benefit from skilled PT intervention in order to decrease pain with vaginal penetration, prepare for pain free intercourse, decrease urinary urgency, address impairments, and improve quality of life.   OBJECTIVE IMPAIRMENTS: decreased activity tolerance, decreased coordination, decreased endurance, decreased mobility, decreased ROM, decreased strength, increased fascial restrictions, increased muscle spasms, impaired flexibility, impaired tone, improper body mechanics, postural dysfunction, and pain.   ACTIVITY LIMITATIONS: continence and vaginal penetration  PARTICIPATION LIMITATIONS: gynecological care, interpersonal relationship  PERSONAL FACTORS: 1 comorbidity: medical history are also affecting  patient's functional outcome.   REHAB POTENTIAL: Good  CLINICAL DECISION MAKING: Stable/uncomplicated  EVALUATION COMPLEXITY: Low   GOALS: Goals reviewed with patient? Yes  SHORT TERM GOALS: Target date: 05/09/2024   Pt will be independent with HEP in order to improve activity tolerance.   Baseline: Goal status: MET 05/16/24  2.  Pt will begin dilator program and use 3x/week in order to decrease vaginal tightness and sensitivity.   Baseline: no dilator program Goal status: MET 05/16/24  3.  Pt will increase all impaired lumbar A/ROM by 25% without pain in order to improve length tension relationship in pelvic floor muscles, decreasing urinary urgency and incontinence.    Baseline:  Goal status: IN PROGRESS 05/16/24  4.  Pt will be able to perform curl-up test without abdominal distortion in order to decrease bearing down on pelvic floor to improve urinary urgency.  Baseline:  Goal status:  IN PROGRESS 05/16/24  5.  Pt will be able to teach back and utilize urge suppression technique in order to help reduce number of trips to the bathroom.    Baseline:  Goal status: MET 05/16/24    LONG TERM GOALS: Target date: 09/27/23  Pt will be independent with advanced HEP in order to improve activity tolerance.   Baseline:  Goal status:  IN PROGRESS 05/16/24  2.  Pt will report 0/10 pain with vaginal penetration in order to have comfortable gynecological exam and necessary preventative care.  Baseline: 9/10  Goal status:  IN PROGRESS 05/16/24  3.  Pt will be independent with dilator program in order to achieve pain free vaginal penetration. Baseline: 9/10 pain Goal status:  IN PROGRESS 05/16/24  4.  Pt will be able to go 2-3 hours in between voids without urgency or incontinence in order to improve QOL and perform all functional activities with less difficulty.   Baseline: sudden urgency and leaking on the way to the bathroom Goal status:  IN PROGRESS 05/16/24  5.  Pt  will be able to perform 3/3 sit-up test without abdominal distortion in order to decrease bearing down on pelvic floor to decrease urinary urgency.  Baseline: 1/3 Goal status:  IN PROGRESS 05/16/24  PLAN:  PT FREQUENCY: 1-2x/week  PT DURATION: 16 visits   PLANNED INTERVENTIONS: 97164- PT Re-evaluation, 97110-Therapeutic exercises, 97530- Therapeutic activity, 97112- Neuromuscular re-education, 97535- Self Care, 02859- Manual therapy, [phone]- Gait training, [phone]- Aquatic Therapy, [phone]- Electrical stimulation (unattended), [phone]- Traction (mechanical), [id number]- Ionotophoresis 4mg /ml Dexamethasone, 79439 (1-2 muscles), 20561 (3+ muscles)- Dry Needling, Patient/Family education, Balance training, Taping, Joint mobilization, Joint manipulation, Spinal manipulation, Spinal mobilization, Scar mobilization, Vestibular training, Cryotherapy, Moist heat, and Biofeedback  PLAN FOR NEXT SESSION: progress down training; diaphragmatic breathing training; core strengthening; dilator education  Josette Mares, PT, DPT12/09/259:40 AM [facility] [address], [address] [place], [place] [address] Phone # [phone] Fax [phone]

## 2024-07-03 ENCOUNTER — Ambulatory Visit

## 2024-07-03 DIAGNOSIS — M62838 Other muscle spasm: Secondary | ICD-10-CM

## 2024-07-03 DIAGNOSIS — R102 Pelvic and perineal pain unspecified side: Secondary | ICD-10-CM

## 2024-07-03 DIAGNOSIS — R293 Abnormal posture: Secondary | ICD-10-CM

## 2024-07-03 DIAGNOSIS — M6281 Muscle weakness (generalized): Secondary | ICD-10-CM

## 2024-07-03 DIAGNOSIS — R279 Unspecified lack of coordination: Secondary | ICD-10-CM

## 2024-07-03 NOTE — Therapy (Signed)
 " OUTPATIENT PHYSICAL THERAPY FEMALE PELVIC TREATMENT   Patient Name: Cynthia Cherry MRN: [id number] DOB:April 05, 1996, 28 y.o., female Today's Date: 07/03/2024  END OF SESSION:  PT End of Session - 07/03/24 1158     Visit Number 9    Date for Recertification  09/26/24    Authorization Type [employer]    PT Start Time 1155    PT Stop Time 1230    PT Time Calculation (min) 35 min    Activity Tolerance Patient tolerated treatment well    Behavior During Therapy WFL for tasks assessed/performed           Past Medical History:  Diagnosis Date   Anxiety    Depression    Gastritis    Helicobacter pylori (H. pylori) infection 2020   Treated with anbx in 2020   Past Surgical History:  Procedure Laterality Date   OTHER SURGICAL HISTORY  2010?   Reports needing to be opened up to have period when in high school. Since this procedure she has had regular menses. Unsure if this was inperforate hymen or cervical stenosis   POLYPECTOMY     Patient Active Problem List   Diagnosis Date Noted   Iron deficiency anemia 05/30/2022   Leukopenia 05/30/2022   Menometrorrhagia 04/01/2021   Dysmenorrhea 04/01/2021    PCP: Marikay Eva POUR, PA  REFERRING PROVIDER: Eldonna Suzen Octave, MD  REFERRING DIAG: [phone] (ICD-10-CM) - Left ovarian cyst N94.6 (ICD-10-CM) - Dysmenorrhea  THERAPY DIAG:  Pelvic pain  Other muscle spasm  Unspecified lack of coordination  Muscle weakness (generalized)  Abnormal posture  Rationale for Evaluation and Treatment: Rehabilitation  ONSET DATE: since getting menstrual cycle  SUBJECTIVE:                                                                                                                                                                                           SUBJECTIVE STATEMENT: Pt states that she is doing well. She has not moved up in dilator sizes yet and is still using size 5. She states that she still has some pain with  this and would like for it to get better before moving up sizes. She feels like exercises for going well. She has been very aware of not letting low back arch as much. She felt like last menstrual cycle was not as painful.   PAIN: 07/03/24 Are you having pain? Yes NPRS scale: 4/10 Pain location: Vaginal using dilator   Pain type: sharp Pain description: intermittent   Aggravating factors: something being in vaginal canal Relieving factors: nothing in vaginal canal  PRECAUTIONS: None  RED FLAGS: None  WEIGHT BEARING RESTRICTIONS: No  FALLS:  Has patient fallen in last 6 months? No  OCCUPATION: journalist  ACTIVITY LEVEL : exercises daily  PLOF: Independent  PATIENT GOALS: be pain free; would like to make sure she does not have pain once she does to have intercourse  PERTINENT HISTORY:  Lt ovarian cyst, dysmenorrhea, anxiety, depression, gastritis Sexual abuse: No  BOWEL MOVEMENT: Pain with bowel movement: No Type of bowel movement:Frequency daily and Strain no Fully empty rectum: Yes:   Leakage: No Pads: No Fiber supplement/laxative No  URINATION: Pain with urination: No Fully empty bladder: Yes:   Stream: Strong Urgency: Yes - started recently - very hard to make it to the bathroom Frequency: every 2 hours  Fluid Intake: 84oz of water Leakage: Urge to void and Walking to the bathroom Pads: No  INTERCOURSE: Abstains   PREGNANCY: NA  PROLAPSE: None   OBJECTIVE:  Note: Objective measures were completed at Evaluation unless otherwise noted.  04/11/24 PATIENT SURVEYS:   PFIQ-7: 43  COGNITION: Overall cognitive status: Within functional limits for tasks assessed     SENSATION: Light touch: Appears intact   FUNCTIONAL TESTS:  Squat: WNL Single leg stance:  Rt: stable  Lt: stable Curl-up test: distortion Sit-up test: 1/3   GAIT: Assistive device utilized: None Comments: WNL  POSTURE: rounded shoulders, forward head, increased lumbar  lordosis, increased thoracic kyphosis, and anterior pelvic tilt   LUMBARAROM/PROM:  A/PROM A/PROM  Eval (% available)  Flexion 100  Extension 50  Right lateral flexion 75  Left lateral flexion 75  Right rotation 50  Left rotation 50   (Blank rows = not tested)  PALPATION:   General: WNL  Pelvic Alignment: significant anterior pelvic tilt, posterior placement of rib cage compared to anterior pelvis  Abdominal: tenderness throughout bil lower abdomen                External Perineal Exam: overall perineal bulge with bearing down                             Internal Pelvic Floor: introital tightness and discomfort bil, Lt>Rt; increased bil deep pelvic floor muscle restriction, Rt>Lt  Patient confirms identification and approves PT to assess internal pelvic floor and treatment Yes  PELVIC MMT:   MMT eval  Vaginal 2/5, 5 seconds, 3 repeat contractions, poor coordination of contract/relax  Diastasis Recti 2 finger width separation  (Blank rows = not tested)        TONE: high  PROLAPSE: Perineal bulge with bearing down  TODAY'S TREATMENT:                                                                                                                              DATE:  07/03/24 Manual: Pt provides verbal consent for internal vaginal/rectal pelvic floor exam. Internal superficial pelvic floor muscle release bil Therapeutic activities: Pt education on dilator progress Pt  education on using smaller dilators to ehlp with more superficial trigger point release   06/11/24 Neuromuscular re-education: Seated diagonal ball press 10x bil Seated horizontal abduction/extension + green band 2 x 10 Seated resisted rotation + green band 2 x 10 bil Seated D2 PNF + green band 2 x 10 bil Standing march + 90 degree shoulder flexion + 5 lbs 2 x 10 Therapeutic activities: Squats to table + 5 lbs 2 x 10 3 way kick 10x each, bil Pallof press + green band 2 x  10   05/20/24 Neuromuscular re-education: Transversus abdominus training with multimodal cues for improved motor control and breath coordination Supine hip adduction ball press with transversus abdominus and pelvic floor muscle contractions and breath coordination 10x Bridge with hip adduction, transversus abdominus, and pelvic floor muscle 2 x 10 Supine modified dead bug 10x bil Bird dog 2 x 10 Bear plank lifts 2 x 10 Modified side plank + clam shell 10x bil   PATIENT EDUCATION:  Education details: See above Person educated: Patient Education method: Programmer, Multimedia, Demonstration, Tactile cues, Verbal cues, and Handouts Education comprehension: verbalized understanding  HOME EXERCISE PROGRAM: [id number]  ASSESSMENT:  CLINICAL IMPRESSION:  Patient is a 28 y.o. female who was seen today for physical therapy treatment for vaginal pain with gynecological exams and vaginal insertion. Pt doing well with dilators, but due to being unable to move up sizes, she wanted to return to internal pelvic floor muscle release today. We found almost band like tension in superficial pelvic floor today that produced sharp-like pain bil. We discussed how to use smaller dilators to help improve restriction here. We also went over dilator progression. She tolerated pelvic floor muscle release to this area well with good improvement in restriction today. She will continue to benefit from skilled PT intervention in order to decrease pain with vaginal penetration, prepare for pain free intercourse, decrease urinary urgency, address impairments, and improve quality of life.   OBJECTIVE IMPAIRMENTS: decreased activity tolerance, decreased coordination, decreased endurance, decreased mobility, decreased ROM, decreased strength, increased fascial restrictions, increased muscle spasms, impaired flexibility, impaired tone, improper body mechanics, postural dysfunction, and pain.   ACTIVITY LIMITATIONS: continence and  vaginal penetration  PARTICIPATION LIMITATIONS: gynecological care, interpersonal relationship  PERSONAL FACTORS: 1 comorbidity: medical history are also affecting patient's functional outcome.   REHAB POTENTIAL: Good  CLINICAL DECISION MAKING: Stable/uncomplicated  EVALUATION COMPLEXITY: Low   GOALS: Goals reviewed with patient? Yes  SHORT TERM GOALS: Target date: 05/09/2024   Pt will be independent with HEP in order to improve activity tolerance.   Baseline: Goal status: MET 05/16/24  2.  Pt will begin dilator program and use 3x/week in order to decrease vaginal tightness and sensitivity.   Baseline: no dilator program Goal status: MET 05/16/24  3.  Pt will increase all impaired lumbar A/ROM by 25% without pain in order to improve length tension relationship in pelvic floor muscles, decreasing urinary urgency and incontinence.    Baseline:  Goal status: IN PROGRESS 07/03/24  4.  Pt will be able to perform curl-up test without abdominal distortion in order to decrease bearing down on pelvic floor to improve urinary urgency.  Baseline:  Goal status:  IN PROGRESS 07/03/24  5.  Pt will be able to teach back and utilize urge suppression technique in order to help reduce number of trips to the bathroom.    Baseline:  Goal status: MET 05/16/24    LONG TERM GOALS: Target date: 09/27/23  Pt will be independent with  advanced HEP in order to improve activity tolerance.   Baseline:  Goal status:  IN PROGRESS 07/03/24  2.  Pt will report 0/10 pain with vaginal penetration in order to have comfortable gynecological exam and necessary preventative care.  Baseline: 9/10 Goal status:  IN PROGRESS 07/03/24  3.  Pt will be independent with dilator program in order to achieve pain free vaginal penetration. Baseline: 9/10 pain Goal status:  IN PROGRESS 07/03/24  4.  Pt will be able to go 2-3 hours in between voids without urgency or incontinence in order to improve QOL and  perform all functional activities with less difficulty.   Baseline: sudden urgency and leaking on the way to the bathroom Goal status:  IN PROGRESS 07/03/24  5.  Pt will be able to perform 3/3 sit-up test without abdominal distortion in order to decrease bearing down on pelvic floor to decrease urinary urgency.  Baseline: 1/3 Goal status:  IN PROGRESS 07/03/24  PLAN:  PT FREQUENCY: 1-2x/week  PT DURATION: 16 visits   PLANNED INTERVENTIONS: 97164- PT Re-evaluation, 97110-Therapeutic exercises, 97530- Therapeutic activity, 97112- Neuromuscular re-education, 97535- Self Care, 02859- Manual therapy, [phone]- Gait training, [phone]- Aquatic Therapy, [phone]- Electrical stimulation (unattended), [phone]- Traction (mechanical), [id number]- Ionotophoresis 4mg /ml Dexamethasone, 79439 (1-2 muscles), 20561 (3+ muscles)- Dry Needling, Patient/Family education, Balance training, Taping, Joint mobilization, Joint manipulation, Spinal manipulation, Spinal mobilization, Scar mobilization, Vestibular training, Cryotherapy, Moist heat, and Biofeedback  PLAN FOR NEXT SESSION: progress down training; diaphragmatic breathing training; core strengthening; dilator education  Josette Mares, PT, DPT12/31/202511:58 AM [facility] [address], [address] [place], [place] [address] Phone # [phone] Fax [phone]     "

## 2024-07-09 ENCOUNTER — Other Ambulatory Visit: Payer: Self-pay | Admitting: Family Medicine

## 2024-07-09 ENCOUNTER — Ambulatory Visit
Admission: RE | Admit: 2024-07-09 | Discharge: 2024-07-09 | Disposition: A | Discharge status: Home / Self Care | Source: Ambulatory Visit | Attending: Family Medicine | Admitting: Family Medicine

## 2024-07-09 DIAGNOSIS — N83202 Unspecified ovarian cyst, left side: Secondary | ICD-10-CM | POA: Diagnosis present

## 2024-07-09 DIAGNOSIS — N946 Dysmenorrhea, unspecified: Secondary | ICD-10-CM | POA: Insufficient documentation

## 2024-07-09 DIAGNOSIS — R102 Pelvic and perineal pain unspecified side: Secondary | ICD-10-CM

## 2024-07-19 ENCOUNTER — Ambulatory Visit: Admitting: Family Medicine

## 2024-07-19 ENCOUNTER — Encounter: Payer: Self-pay | Admitting: Family Medicine

## 2024-07-19 VITALS — BP 107/70 | HR 84 | Wt 131.0 lb

## 2024-07-19 DIAGNOSIS — R9389 Abnormal findings on diagnostic imaging of other specified body structures: Secondary | ICD-10-CM | POA: Diagnosis not present

## 2024-07-19 NOTE — Progress Notes (Signed)
" ° °  GYNECOLOGY PROBLEM  VISIT ENCOUNTER NOTE  Subjective:   Cynthia Cherry is a 29 y.o. G0P0000 female here for a problem GYN visit.  Current complaints: doing well. Here to reviewed results of US  which showed  1/6   IMPRESSION: 1. Asymmetrically thickened endometrium with focal hypoechogenicity in the fundus, possibly representing an endometrial polyp; hysteroscopic evaluation or sonohysterography is recommended. 2. No adnexal mass, and the previously noted left ovarian cyst has resolved   Continues to have heavy and painful cycles that are more regularly .   Denies abnormal vaginal bleeding, discharge, pelvic pain, problems with intercourse or other gynecologic concerns.    Gynecologic History Patient's last menstrual period was 06/20/2024.  Contraception: none  Health Maintenance Due  Topic Date Due   DTaP/Tdap/Td (1 - Tdap) Never done   Pneumococcal Vaccine (1 of 2 - PCV) Never done   Hepatitis B Vaccines 19-59 Average Risk (1 of 3 - 19+ 3-dose series) Never done   COVID-19 Vaccine (3 - Pfizer risk series) 04/04/2020   Influenza Vaccine  Never done    The following portions of the patient's history were reviewed and updated as appropriate: allergies, current medications, past family history, past medical history, past social history, past surgical history and problem list.  Review of Systems Pertinent items are noted in HPI.   Objective:  BP 107/70   Pulse 84   Wt 131 lb (59.4 kg)   LMP 06/20/2024   BMI 20.52 kg/m   Gen: well appearing, NAD HEENT: no scleral icterus CV: RR Lung: Normal WOB Ext: warm well perfused   Assessment and Plan:   Problem List Items Addressed This Visit       Unprioritized   Endometrial thickening on ultrasound - Primary   Discussed suspect polyp and how to work this up as this might be causing some of her symptoms.  Reviewed sonohystergram as diagnostic, procedure in detail with intrauterine dye and radiographic evidence of  polyp in endometrial cavity definitively. This would be diagnostic but not treatment. Reviewed potential benefit to see patency of tubes and patient is not currently trying for pregnancy. Discussed hysteroscopy as direct visualization of endometrium and the possible real time removal of polyp. This would be potentially diagnostic and curative for a likely polyp.  Both options are valid. Provided time of patient to ask clarifying questions about options Jared is currently unsure which she would like to proceed with.  Realized after leaving she might be eligible for in office hysteroscopy and this was originally presented as OR option. Regardless she know this procedure would be with an OB GYN provider.  She will contact office with there choice.       Face to face time:  30 minutes  Greater than 50% of the visit time was spent in counseling and coordination of care with the patient.  The summary and outline of the counseling and care coordination is summarized in the note above.   All questions were answered.  Please refer to After Visit Summary for other counseling recommendations.   No follow-ups on file.  Suzen Maryan Masters, MD, MPH, ABFM Attending Physician [facility]  "

## 2024-07-19 NOTE — Progress Notes (Signed)
 Pt is in office for u/s follow up, pt would like to discuss procedures noted on results.

## 2024-07-19 NOTE — Assessment & Plan Note (Addendum)
 Discussed suspect polyp and how to work this up as this might be causing some of her symptoms.  Reviewed sonohystergram as diagnostic, procedure in detail with intrauterine dye and radiographic evidence of polyp in endometrial cavity definitively. This would be diagnostic but not treatment. Reviewed potential benefit to see patency of tubes and patient is not currently trying for pregnancy. Discussed hysteroscopy as direct visualization of endometrium and the possible real time removal of polyp. This would be potentially diagnostic and curative for a likely polyp.  Both options are valid. Provided time of patient to ask clarifying questions about options Adelyn is currently unsure which she would like to proceed with.  Realized after leaving she might be eligible for in office hysteroscopy and this was originally presented as OR option. Regardless she know this procedure would be with an OB GYN provider.  She will contact office with there choice.

## 2024-07-25 ENCOUNTER — Ambulatory Visit: Attending: Family Medicine

## 2024-07-25 DIAGNOSIS — R102 Pelvic and perineal pain unspecified side: Secondary | ICD-10-CM | POA: Insufficient documentation

## 2024-07-25 DIAGNOSIS — R279 Unspecified lack of coordination: Secondary | ICD-10-CM | POA: Diagnosis present

## 2024-07-25 DIAGNOSIS — M62838 Other muscle spasm: Secondary | ICD-10-CM | POA: Insufficient documentation

## 2024-07-25 DIAGNOSIS — M6281 Muscle weakness (generalized): Secondary | ICD-10-CM | POA: Insufficient documentation

## 2024-07-25 DIAGNOSIS — R293 Abnormal posture: Secondary | ICD-10-CM | POA: Diagnosis present

## 2024-07-25 NOTE — Therapy (Signed)
 " OUTPATIENT PHYSICAL THERAPY FEMALE PELVIC TREATMENT   Patient Name: Cynthia Cherry MRN: [id number] DOB:1996/04/03, 29 y.o., female Today's Date: 07/25/2024  END OF SESSION:  PT End of Session - 07/25/24 1109     Visit Number 10    Date for Recertification  09/26/24    Authorization Type [employer]    PT Start Time 1107    PT Stop Time 1145    PT Time Calculation (min) 38 min    Activity Tolerance Patient tolerated treatment well    Behavior During Therapy WFL for tasks assessed/performed           Past Medical History:  Diagnosis Date   Anxiety    Depression    Gastritis    Helicobacter pylori (H. pylori) infection 2020   Treated with anbx in 2020   Past Surgical History:  Procedure Laterality Date   OTHER SURGICAL HISTORY  2010?   Reports needing to be opened up to have period when in high school. Since this procedure she has had regular menses. Unsure if this was inperforate hymen or cervical stenosis   POLYPECTOMY     Patient Active Problem List   Diagnosis Date Noted   Endometrial thickening on ultrasound 07/19/2024   Iron deficiency anemia 05/30/2022   Leukopenia 05/30/2022   Menometrorrhagia 04/01/2021   Dysmenorrhea 04/01/2021    PCP: Marikay Eva POUR, PA  REFERRING PROVIDER: Eldonna Suzen Octave, MD  REFERRING DIAG: [phone] (ICD-10-CM) - Left ovarian cyst N94.6 (ICD-10-CM) - Dysmenorrhea  THERAPY DIAG:  Pelvic pain  Other muscle spasm  Unspecified lack of coordination  Muscle weakness (generalized)  Abnormal posture  Rationale for Evaluation and Treatment: Rehabilitation  ONSET DATE: since getting menstrual cycle  SUBJECTIVE:                                                                                                                                                                                           SUBJECTIVE STATEMENT: Pt states that she has been having continually heavier and more painful periods. She was found to  have a polyp on her ovary that might be causing this. She has moved up a size in dilators.  PAIN: 07/03/24 Are you having pain? Yes NPRS scale: 4/10 Pain location: Vaginal using dilator   Pain type: sharp Pain description: intermittent   Aggravating factors: something being in vaginal canal Relieving factors: nothing in vaginal canal  PRECAUTIONS: None  RED FLAGS: None   WEIGHT BEARING RESTRICTIONS: No  FALLS:  Has patient fallen in last 6 months? No  OCCUPATION: journalist  ACTIVITY LEVEL : exercises daily  PLOF: Independent  PATIENT GOALS: be pain  free; would like to make sure she does not have pain once she does to have intercourse  PERTINENT HISTORY:  Lt ovarian cyst, dysmenorrhea, anxiety, depression, gastritis Sexual abuse: No  BOWEL MOVEMENT: Pain with bowel movement: No Type of bowel movement:Frequency daily and Strain no Fully empty rectum: Yes:   Leakage: No Pads: No Fiber supplement/laxative No  URINATION: Pain with urination: No Fully empty bladder: Yes:   Stream: Strong Urgency: Yes - started recently - very hard to make it to the bathroom Frequency: every 2 hours  Fluid Intake: 84oz of water Leakage: Urge to void and Walking to the bathroom Pads: No  INTERCOURSE: Abstains   PREGNANCY: NA  PROLAPSE: None   OBJECTIVE:  Note: Objective measures were completed at Evaluation unless otherwise noted.  04/11/24 PATIENT SURVEYS:   PFIQ-7: 43  COGNITION: Overall cognitive status: Within functional limits for tasks assessed     SENSATION: Light touch: Appears intact   FUNCTIONAL TESTS:  Squat: WNL Single leg stance:  Rt: stable  Lt: stable Curl-up test: distortion Sit-up test: 1/3   GAIT: Assistive device utilized: None Comments: WNL  POSTURE: rounded shoulders, forward head, increased lumbar lordosis, increased thoracic kyphosis, and anterior pelvic tilt   LUMBARAROM/PROM:  A/PROM A/PROM  Eval (% available)  Flexion  100  Extension 50  Right lateral flexion 75  Left lateral flexion 75  Right rotation 50  Left rotation 50   (Blank rows = not tested)  PALPATION:   General: WNL  Pelvic Alignment: significant anterior pelvic tilt, posterior placement of rib cage compared to anterior pelvis  Abdominal: tenderness throughout bil lower abdomen                External Perineal Exam: overall perineal bulge with bearing down                             Internal Pelvic Floor: introital tightness and discomfort bil, Lt>Rt; increased bil deep pelvic floor muscle restriction, Rt>Lt  Patient confirms identification and approves PT to assess internal pelvic floor and treatment Yes  PELVIC MMT:   MMT eval  Vaginal 2/5, 5 seconds, 3 repeat contractions, poor coordination of contract/relax  Diastasis Recti 2 finger width separation  (Blank rows = not tested)        TONE: high  PROLAPSE: Perineal bulge with bearing down  TODAY'S TREATMENT:                                                                                                                              DATE:  07/25/2024 Manual: Pt provides verbal consent for internal vaginal/rectal pelvic floor exam. Internal superficial pelvic floor muscle release bil Therapeutic activities: Pt education on dilator progress Pt education on using smaller dilators to ehlp with more superficial trigger point release  07/03/24 Manual: Pt provides verbal consent for internal vaginal/rectal pelvic floor exam. Internal superficial pelvic floor muscle release  bil Therapeutic activities: Pt education on dilator progress Pt education on using smaller dilators to ehlp with more superficial trigger point release   06/11/24 Neuromuscular re-education: Seated diagonal ball press 10x bil Seated horizontal abduction/extension + green band 2 x 10 Seated resisted rotation + green band 2 x 10 bil Seated D2 PNF + green band 2 x 10 bil Standing march + 90 degree  shoulder flexion + 5 lbs 2 x 10 Therapeutic activities: Squats to table + 5 lbs 2 x 10 3 way kick 10x each, bil Pallof press + green band 2 x 10    PATIENT EDUCATION:  Education details: See above Person educated: Patient Education method: Explanation, Demonstration, Tactile cues, Verbal cues, and Handouts Education comprehension: verbalized understanding  HOME EXERCISE PROGRAM: [id number]  ASSESSMENT:  CLINICAL IMPRESSION:  Patient is a 29 y.o. female who was seen today for physical therapy treatment for vaginal pain with gynecological exams and vaginal insertion. Pt doing very well overall and was able to move up a dilator size. She does still have some discomfort with this that she would like to address through manual techniques this session. She tolerated all techniques well. Most painful area noted to be very superficial and to he Rt. She will continue to benefit from skilled PT intervention in order to decrease pain with vaginal penetration, prepare for pain free intercourse, decrease urinary urgency, address impairments, and improve quality of life.   OBJECTIVE IMPAIRMENTS: decreased activity tolerance, decreased coordination, decreased endurance, decreased mobility, decreased ROM, decreased strength, increased fascial restrictions, increased muscle spasms, impaired flexibility, impaired tone, improper body mechanics, postural dysfunction, and pain.   ACTIVITY LIMITATIONS: continence and vaginal penetration  PARTICIPATION LIMITATIONS: gynecological care, interpersonal relationship  PERSONAL FACTORS: 1 comorbidity: medical history are also affecting patient's functional outcome.   REHAB POTENTIAL: Good  CLINICAL DECISION MAKING: Stable/uncomplicated  EVALUATION COMPLEXITY: Low   GOALS: Goals reviewed with patient? Yes  SHORT TERM GOALS: Target date: 05/09/2024   Pt will be independent with HEP in order to improve activity tolerance.   Baseline: Goal status: MET  05/16/24  2.  Pt will begin dilator program and use 3x/week in order to decrease vaginal tightness and sensitivity.   Baseline: no dilator program Goal status: MET 05/16/24  3.  Pt will increase all impaired lumbar A/ROM by 25% without pain in order to improve length tension relationship in pelvic floor muscles, decreasing urinary urgency and incontinence.    Baseline:  Goal status: IN PROGRESS 07/03/24  4.  Pt will be able to perform curl-up test without abdominal distortion in order to decrease bearing down on pelvic floor to improve urinary urgency.  Baseline:  Goal status:  IN PROGRESS 07/03/24  5.  Pt will be able to teach back and utilize urge suppression technique in order to help reduce number of trips to the bathroom.    Baseline:  Goal status: MET 05/16/24    LONG TERM GOALS: Target date: 09/27/23  Pt will be independent with advanced HEP in order to improve activity tolerance.   Baseline:  Goal status:  IN PROGRESS 07/03/24  2.  Pt will report 0/10 pain with vaginal penetration in order to have comfortable gynecological exam and necessary preventative care.  Baseline: 9/10 Goal status:  IN PROGRESS 07/03/24  3.  Pt will be independent with dilator program in order to achieve pain free vaginal penetration. Baseline: 9/10 pain Goal status:  IN PROGRESS 07/03/24  4.  Pt will be able to go 2-3 hours in  between voids without urgency or incontinence in order to improve QOL and perform all functional activities with less difficulty.   Baseline: sudden urgency and leaking on the way to the bathroom Goal status:  IN PROGRESS 07/03/24  5.  Pt will be able to perform 3/3 sit-up test without abdominal distortion in order to decrease bearing down on pelvic floor to decrease urinary urgency.  Baseline: 1/3 Goal status:  IN PROGRESS 07/03/24  PLAN:  PT FREQUENCY: 1-2x/week  PT DURATION: 16 visits   PLANNED INTERVENTIONS: [id number]- PT Re-evaluation, 97110-Therapeutic  exercises, 97530- Therapeutic activity, 97112- Neuromuscular re-education, 97535- Self Care, [id number]- Manual therapy, [phone]- Gait training, [phone]- Aquatic Therapy, [phone]- Electrical stimulation (unattended), [phone]- Traction (mechanical), [id number]- Ionotophoresis 4mg /ml Dexamethasone, [id number] (1-2 muscles), [id number] (3+ muscles)- Dry Needling, Patient/Family education, Balance training, Taping, Joint mobilization, Joint manipulation, Spinal manipulation, Spinal mobilization, Scar mobilization, Vestibular training, Cryotherapy, Moist heat, and Biofeedback  PLAN FOR NEXT SESSION: progress down training; diaphragmatic breathing training; core strengthening; dilator education  Josette Mares, PT, DPT01/22/2611:10 AM [facility] [address], [address] [place], [place] [address] Phone # [phone] Fax [phone]     "

## 2024-08-12 ENCOUNTER — Ambulatory Visit: Attending: Family Medicine

## 2024-08-13 ENCOUNTER — Inpatient Hospital Stay: Admitting: Nurse Practitioner

## 2024-08-13 ENCOUNTER — Inpatient Hospital Stay

## 2024-09-02 ENCOUNTER — Ambulatory Visit: Attending: Family Medicine

## 2024-09-24 ENCOUNTER — Ambulatory Visit
